# Patient Record
Sex: Male | Born: 1940
Health system: Southern US, Community
[De-identification: ages and names within clinical notes are randomized; demographics above are authoritative.]

## PROBLEM LIST (undated history)

## (undated) DIAGNOSIS — S12100A Unspecified displaced fracture of second cervical vertebra, initial encounter for closed fracture: Secondary | ICD-10-CM

## (undated) DIAGNOSIS — C4490 Unspecified malignant neoplasm of skin, unspecified: Secondary | ICD-10-CM

## (undated) DIAGNOSIS — I4891 Unspecified atrial fibrillation: Secondary | ICD-10-CM

## (undated) DIAGNOSIS — E785 Hyperlipidemia, unspecified: Secondary | ICD-10-CM

## (undated) DIAGNOSIS — I10 Essential (primary) hypertension: Secondary | ICD-10-CM

## (undated) DIAGNOSIS — M109 Gout, unspecified: Secondary | ICD-10-CM

## (undated) HISTORY — PX: CERVICAL DISC ARTHROPLASTY: SHX587

## (undated) HISTORY — PX: MOHS SURGERY: SHX181

## (undated) HISTORY — DX: Unspecified displaced fracture of second cervical vertebra, initial encounter for closed fracture: S12.100A

## (undated) HISTORY — PX: PROSTATE BIOPSY: SHX241

## (undated) HISTORY — DX: Gilbert syndrome: E80.4

## (undated) HISTORY — DX: Unspecified atrial fibrillation: I48.91

## (undated) HISTORY — PX: TONSILLECTOMY: SUR1361

## (undated) HISTORY — DX: Hyperlipidemia, unspecified: E78.5

---

## 1997-10-30 ENCOUNTER — Emergency Department (HOSPITAL_COMMUNITY): Admission: EM | Admit: 1997-10-30 | Discharge: 1997-10-30 | Payer: Self-pay | Admitting: Emergency Medicine

## 1998-08-09 ENCOUNTER — Encounter: Payer: Self-pay | Admitting: Internal Medicine

## 1998-08-09 ENCOUNTER — Ambulatory Visit (HOSPITAL_COMMUNITY): Admission: RE | Admit: 1998-08-09 | Discharge: 1998-08-09 | Payer: Self-pay | Admitting: Internal Medicine

## 2001-02-03 ENCOUNTER — Ambulatory Visit (HOSPITAL_COMMUNITY): Admission: RE | Admit: 2001-02-03 | Discharge: 2001-02-03 | Payer: Self-pay | Admitting: Internal Medicine

## 2002-01-04 ENCOUNTER — Ambulatory Visit (HOSPITAL_COMMUNITY): Admission: RE | Admit: 2002-01-04 | Discharge: 2002-01-04 | Payer: Self-pay | Admitting: Gastroenterology

## 2002-06-02 ENCOUNTER — Emergency Department (HOSPITAL_COMMUNITY): Admission: EM | Admit: 2002-06-02 | Discharge: 2002-06-02 | Payer: Self-pay | Admitting: Emergency Medicine

## 2002-06-02 ENCOUNTER — Encounter: Payer: Self-pay | Admitting: Emergency Medicine

## 2003-10-09 ENCOUNTER — Encounter: Admission: RE | Admit: 2003-10-09 | Discharge: 2003-10-09 | Payer: Self-pay | Admitting: Internal Medicine

## 2003-11-08 ENCOUNTER — Ambulatory Visit (HOSPITAL_COMMUNITY): Admission: RE | Admit: 2003-11-08 | Discharge: 2003-11-08 | Payer: Self-pay | Admitting: Neurosurgery

## 2004-02-13 ENCOUNTER — Inpatient Hospital Stay (HOSPITAL_COMMUNITY): Admission: RE | Admit: 2004-02-13 | Discharge: 2004-02-22 | Payer: Self-pay | Admitting: Neurosurgery

## 2004-02-21 IMAGING — CR DG CERVICAL SPINE 1V
1 series · 1 of 1 positions shown · non-contrast
Comparison: Cervical CT myelogram [DATE].

CLINICAL DATA: Cervical corpectomy with fusion from C3 through C6.

CERVICAL SPINE - ONE VIEW [DATE]:

[view not recorded]
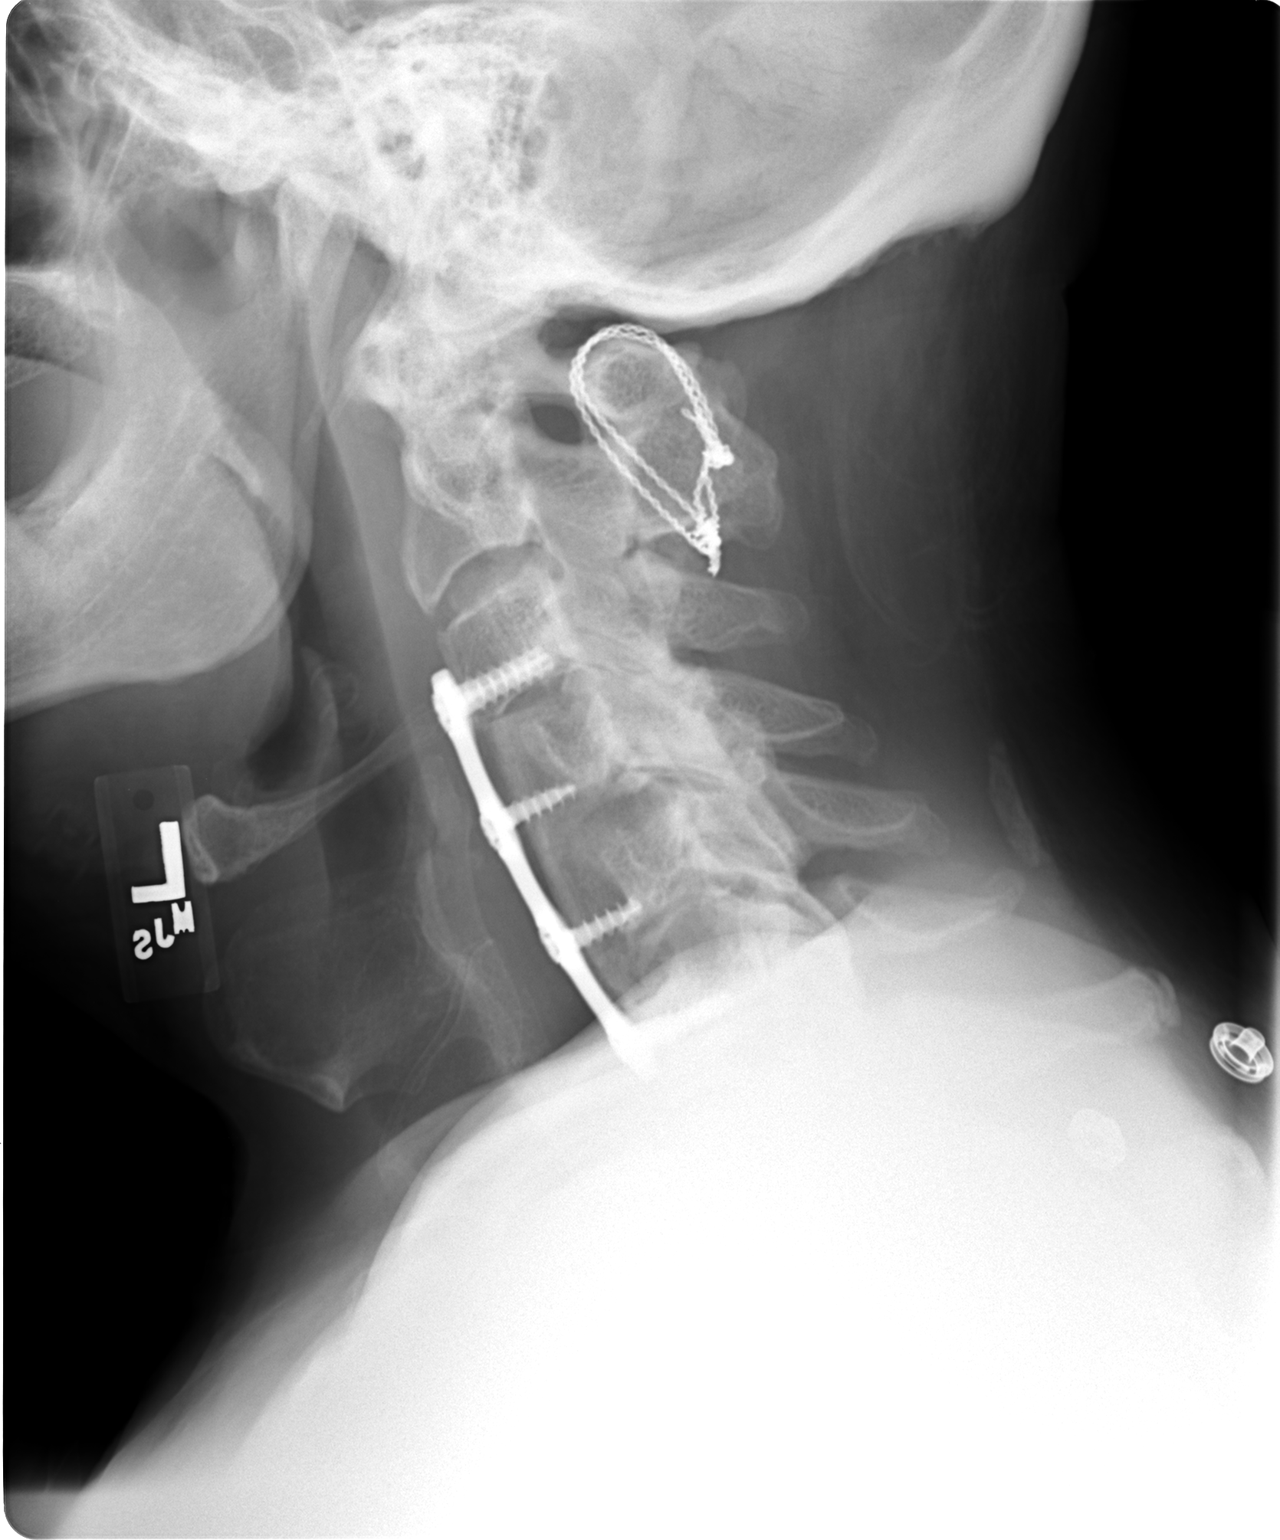

[1 of 1 positions shown; findings below may reference images not displayed]

FINDINGS: Lateral view of the cervical spine demonstrates cervical corpectomy with fusion from C3
through C6. There has also been posterior fusion at C1-C2 with the use of cerclage wires. There is
3-4 mm of spondylolisthesis of C4 relative to C5, though this was present on the preoperative CT
myelogram and is unchanged C7 was not visualized on this examination. Calcification or ossification
in the nuchal ligament is noted posterior to C5.

IMPRESSION

Status post corpectomy with fusion from C3 through C6. The slight spondylolisthesis of C4 relative
to C-5 is unchanged from the preoperative CT myelogram. No complicating features are noted. The
patient has also undergone C1-C2 posterior fusion with cerclage wires.

## 2004-03-13 ENCOUNTER — Encounter: Admission: RE | Admit: 2004-03-13 | Discharge: 2004-05-16 | Payer: Self-pay | Admitting: Neurosurgery

## 2004-07-28 ENCOUNTER — Observation Stay (HOSPITAL_COMMUNITY): Admission: EM | Admit: 2004-07-28 | Discharge: 2004-07-29 | Payer: Self-pay | Admitting: Emergency Medicine

## 2005-12-24 ENCOUNTER — Emergency Department (HOSPITAL_COMMUNITY): Admission: EM | Admit: 2005-12-24 | Discharge: 2005-12-24 | Payer: Self-pay | Admitting: Emergency Medicine

## 2011-11-22 ENCOUNTER — Emergency Department (HOSPITAL_BASED_OUTPATIENT_CLINIC_OR_DEPARTMENT_OTHER): Payer: Medicare Other

## 2011-11-22 ENCOUNTER — Emergency Department (HOSPITAL_BASED_OUTPATIENT_CLINIC_OR_DEPARTMENT_OTHER)
Admission: EM | Admit: 2011-11-22 | Discharge: 2011-11-22 | Disposition: A | Discharge status: Home / Self Care | Payer: Medicare Other | Attending: Emergency Medicine | Admitting: Emergency Medicine

## 2011-11-22 ENCOUNTER — Encounter (HOSPITAL_BASED_OUTPATIENT_CLINIC_OR_DEPARTMENT_OTHER): Payer: Self-pay | Admitting: *Deleted

## 2011-11-22 DIAGNOSIS — S6990XA Unspecified injury of unspecified wrist, hand and finger(s), initial encounter: Secondary | ICD-10-CM | POA: Insufficient documentation

## 2011-11-22 DIAGNOSIS — Y92838 Other recreation area as the place of occurrence of the external cause: Secondary | ICD-10-CM | POA: Insufficient documentation

## 2011-11-22 DIAGNOSIS — Z87891 Personal history of nicotine dependence: Secondary | ICD-10-CM | POA: Insufficient documentation

## 2011-11-22 DIAGNOSIS — M25529 Pain in unspecified elbow: Secondary | ICD-10-CM

## 2011-11-22 DIAGNOSIS — Y9239 Other specified sports and athletic area as the place of occurrence of the external cause: Secondary | ICD-10-CM | POA: Insufficient documentation

## 2011-11-22 DIAGNOSIS — Z85828 Personal history of other malignant neoplasm of skin: Secondary | ICD-10-CM | POA: Insufficient documentation

## 2011-11-22 DIAGNOSIS — S59909A Unspecified injury of unspecified elbow, initial encounter: Secondary | ICD-10-CM | POA: Insufficient documentation

## 2011-11-22 DIAGNOSIS — X503XXA Overexertion from repetitive movements, initial encounter: Secondary | ICD-10-CM

## 2011-11-22 DIAGNOSIS — M109 Gout, unspecified: Secondary | ICD-10-CM | POA: Insufficient documentation

## 2011-11-22 DIAGNOSIS — I10 Essential (primary) hypertension: Secondary | ICD-10-CM | POA: Insufficient documentation

## 2011-11-22 HISTORY — DX: Essential (primary) hypertension: I10

## 2011-11-22 HISTORY — DX: Gout, unspecified: M10.9

## 2011-11-22 HISTORY — DX: Unspecified malignant neoplasm of skin, unspecified: C44.90

## 2011-11-22 MED ORDER — PREDNISONE 20 MG PO TABS
40.0000 mg | ORAL_TABLET | Freq: Once | ORAL | Status: AC
  Administered 2011-11-22: 40 mg via ORAL
  Filled 2011-11-22: qty 2

## 2011-11-22 MED ORDER — OXYCODONE-ACETAMINOPHEN 5-325 MG PO TABS: 1.0000 | ORAL_TABLET | ORAL | Status: AC | PRN

## 2011-11-22 MED ORDER — PREDNISONE 20 MG PO TABS: 40.0000 mg | ORAL_TABLET | Freq: Every day | ORAL | Status: AC

## 2011-11-22 MED ORDER — OXYCODONE-ACETAMINOPHEN 5-325 MG PO TABS
2.0000 | ORAL_TABLET | Freq: Once | ORAL | Status: AC
  Administered 2011-11-22: 2 via ORAL
  Filled 2011-11-22: qty 2

## 2011-11-22 MED ORDER — IBUPROFEN 400 MG PO TABS
400.0000 mg | ORAL_TABLET | Freq: Once | ORAL | Status: DC
  Filled 2011-11-22: qty 1

## 2011-11-22 NOTE — ED Provider Notes (Signed)
History   This chart was scribed for Wayne Razor, MD by Wayne Howell. The patient was seen in room MH12/MH12. Patient's care was started at 1609.     CSN: [id number]  Arrival date & time 11/22/11  1609   First MD Initiated Contact with Patient 11/22/11 1614      Chief Complaint  Patient presents with  . Arm Injury    (Consider location/radiation/quality/duration/timing/severity/associated sxs/prior treatment) Patient is a 71 y.o. male presenting with arm injury. The history is provided by the patient. No language interpreter was used.  Arm Injury  The incident occurred more than 2 days ago. Incident location: The gym. The injury mechanism was a pulled limb. Context: Weight lifting. The wounds were self-inflicted. There is an injury to the right elbow. The pain is severe. It is unlikely that a foreign body is present. Associated symptoms include pain when bearing weight. Pertinent negatives include no chest pain, no fussiness, no numbness, no visual disturbance, no bladder incontinence, no headaches, no hearing loss, no cough and no difficulty breathing. There have been no prior injuries to these areas.    Wayne Howell is a 71 y.o. male who presents to the Emergency Department complaining of waxing and waning, gradually worsening, moderate to severe right elbow pain onset 3 days ago. Patient says that he went to the gym on Wednesday to work out his arms. He increased his lifting weight and his reps. Patient says that patient reached to hold open a door at lunch on Thursday and felt a 'twinge." Patient denies fevers, chills, or any other pain. Patient has been taking Tylenol and Colycrys (gout medicine). He says that Tylenol provides no relief, but the gout medication provided some moderate relief. Patient has also been applying warm compresses with some relief. Patient says that he has had tennis elbow years ago, but it was never this severe. Patient says that he has intermittent pain in  his right arm as a result of a nicked nerve during a surgery several years ago. Patient has a medical history of gout, skin cancer and HTN. Surgical history includes tonsillectomy, Mohs surgery and cervical disc arthroplasty. Patient is a former smoker. Patient is allergic to codeine.  Past Medical History  Diagnosis Date  . Gout   . Skin cancer   . Hypertension     Past Surgical History  Procedure Date  . Cervical disc arthroplasty   . Tonsillectomy   . Mohs surgery     No family history on file.  History  Substance Use Topics  . Smoking status: Former Games developer  . Smokeless tobacco: Never Used  . Alcohol Use: 1.5 oz/week    3 drink(s) per week      Review of Systems  Constitutional: Negative for fever and chills.  HENT: Negative for hearing loss.   Eyes: Negative for visual disturbance.  Respiratory: Negative for cough.   Cardiovascular: Negative for chest pain.  Genitourinary: Negative for bladder incontinence.  Musculoskeletal:       Right elbow pain  Neurological: Negative for numbness and headaches.  All other systems reviewed and are negative.    Allergies  Review of patient's allergies indicates not on file.  Home Medications  No current outpatient prescriptions on file.  BP 162/68  Pulse 64  Temp 97.9 F (36.6 C) (Oral)  Resp 20  Ht 5\' 10"  (1.778 m)  Wt 225 lb (102.059 kg)  BMI 32.28 kg/m2  SpO2 98%  Physical Exam  Nursing note and vitals reviewed.  Constitutional: He appears well-developed and well-nourished. No distress.  HENT:  Head: Normocephalic and atraumatic.  Eyes: Conjunctivae are normal. Right eye exhibits no discharge. Left eye exhibits no discharge.  Neck: Neck supple.  Cardiovascular: Normal rate, regular rhythm and normal heart sounds.  Exam reveals no gallop and no friction rub.   No murmur heard. Pulmonary/Chest: Effort normal and breath sounds normal. No respiratory distress.  Abdominal: Soft. He exhibits no distension. There  is no tenderness.  Musculoskeletal: He exhibits no edema and no tenderness.       Right elbow: He exhibits swelling.       Right elbow is swollen. No increased warmth or erythema. Pain throughout ROM. Neurovascularly intact distally.  Neurological: He is alert.  Skin: Skin is warm and dry.  Psychiatric: He has a normal mood and affect. His behavior is normal. Thought content normal.    ED Course  Procedures (including critical care time) DIAGNOSTIC STUDIES: Oxygen Saturation is 98% on room air, normal by my interpretation.    COORDINATION OF CARE: 4:40pm- Patient informed of current plan for treatment and evaluation and agrees with plan at this time.   Labs Reviewed - No data to display   Dg Elbow Complete Right  11/22/2011  *RADIOLOGY REPORT*  Clinical Data: Right elbow pain and swelling  RIGHT ELBOW - COMPLETE 3+ VIEW  Comparison: None.  Findings: Four views of the right elbow submitted.  No acute fracture or subluxation.  There is soft tissue swelling dorsal elbow region.  No posterior fat pad sign.  IMPRESSION: No acute fracture or subluxation.  Soft tissue swelling dorsally elbow region.  Original Report Authenticated By: Wayne Howell, M.D.     1. Overuse injury   2. Elbow pain       MDM  71 year male with right elbow pain. Suspect overuse injury. Consider Calc given his history but doubt. Doubt infectious. Plan symptomatic treatment. Return precautions discussed. Outpatient followup.      I personally preformed the services scribed in my presence. The recorded information has been reviewed and considered. Wayne Razor, MD.    Wayne Razor, MD 11/27/11 1248

## 2011-11-22 NOTE — ED Notes (Signed)
Pt reports he worked out [employer] on Wednesday- states he reached for something Thursday and has had right elbow pain since then

## 2013-05-19 ENCOUNTER — Other Ambulatory Visit: Payer: Self-pay | Admitting: Internal Medicine

## 2013-05-19 ENCOUNTER — Ambulatory Visit
Admission: RE | Admit: 2013-05-19 | Discharge: 2013-05-19 | Disposition: A | Discharge status: Home / Self Care | Payer: Medicare HMO | Source: Ambulatory Visit | Attending: Internal Medicine | Admitting: Internal Medicine

## 2013-05-19 DIAGNOSIS — M25559 Pain in unspecified hip: Secondary | ICD-10-CM

## 2013-12-20 ENCOUNTER — Ambulatory Visit
Admission: RE | Admit: 2013-12-20 | Discharge: 2013-12-20 | Disposition: A | Discharge status: Home / Self Care | Payer: Commercial Managed Care - HMO | Source: Ambulatory Visit | Attending: Internal Medicine | Admitting: Internal Medicine

## 2013-12-20 ENCOUNTER — Other Ambulatory Visit: Payer: Self-pay | Admitting: Internal Medicine

## 2013-12-20 DIAGNOSIS — M25512 Pain in left shoulder: Secondary | ICD-10-CM

## 2014-04-13 ENCOUNTER — Telehealth: Payer: Self-pay | Admitting: Cardiology

## 2014-04-13 NOTE — Telephone Encounter (Signed)
Received medical records on Wayne Howell from [facility] for upcoming appointment to see Dr. Percival Spanish on 04/16/2014, records given to [facility].  cbr

## 2014-04-16 ENCOUNTER — Ambulatory Visit (INDEPENDENT_AMBULATORY_CARE_PROVIDER_SITE_OTHER): Payer: Commercial Managed Care - HMO | Admitting: Cardiology

## 2014-04-16 ENCOUNTER — Encounter: Payer: Self-pay | Admitting: Cardiology

## 2014-04-16 VITALS — BP 140/80 | HR 76 | Ht 70.0 in | Wt 223.9 lb

## 2014-04-16 DIAGNOSIS — I481 Persistent atrial fibrillation: Secondary | ICD-10-CM

## 2014-04-16 DIAGNOSIS — I4819 Other persistent atrial fibrillation: Secondary | ICD-10-CM

## 2014-04-16 DIAGNOSIS — I4891 Unspecified atrial fibrillation: Secondary | ICD-10-CM | POA: Insufficient documentation

## 2014-04-16 NOTE — Patient Instructions (Addendum)
We are going to order a 24 hr holter monitor  We are going to order an Echo for to get done  We are going to schedule a cardioversion for you to have done in one month

## 2014-04-16 NOTE — Progress Notes (Signed)
HPI The patient presents for evaluation of new atrial fibrillation. He was found to have this recently to have atrial fibrillation. He noticed last week that he was a little bit nauseated. He couldn't breathe properly though he wasn't short of breath. He has not been having any chest pressure, neck or arm discomfort. He has not been having any palpitations, presyncope or syncope. His heart rate has been slightly elevated.  He is somewhat limited by back pain. He was working out with a Clinical research associate and he injured himself somewhat. However, with his activities he denies any cardiovascular symptoms.  The patient denies any new symptoms such as chest discomfort, neck or arm discomfort. There has been no new shortness of breath, PND or orthopnea. There have been no reported palpitations, presyncope or syncope.  Allergies  Allergen Reactions  . Codeine Nausea And Vomiting    Current Outpatient Prescriptions  Medication Sig Dispense Refill  . CRESTOR 5 MG tablet Take 5 mg by mouth daily.     Marland Kitchen lisinopril (PRINIVIL,ZESTRIL) 20 MG tablet Take 20 mg by mouth daily.    . rivaroxaban (XARELTO) 20 MG TABS tablet Take 20 mg by mouth daily.    . rosuvastatin (CRESTOR) 10 MG tablet Take 10 mg by mouth daily.    . tamsulosin (FLOMAX) 0.4 MG CAPS capsule      No current facility-administered medications for this visit.    Past Medical History  Diagnosis Date  . Gout   . Skin cancer   . Hypertension   . Gilbert syndrome   . Atrial fibrillation   . Hyperlipidemia   . Gilbert's disease   . C2 cervical fracture     Past Surgical History  Procedure Laterality Date  . Cervical disc arthroplasty    . Tonsillectomy    . Mohs surgery      Family History  Problem Relation Age of Onset  . Cancer Mother     breast  . Heart attack Father   . Heart disease Father     History   Social History  . Marital Status: Married    Spouse Name: N/A    Number of Children: N/A  . Years of Education: N/A    Occupational History  . Not on file.   Social History Main Topics  . Smoking status: Former Research scientist (life sciences)  . Smokeless tobacco: Never Used  . Alcohol Use: 1.5 oz/week    3 drink(s) per week  . Drug Use: No  . Sexual Activity: Not on file   Other Topics Concern  . Not on file   Social History Narrative    ROS:  Limited by lower back.  Otherwise as stated in the HPI and negative for all other systems.    PHYSICAL EXAM BP 140/80 mmHg  Pulse 76  Ht 5\' 10"  (1.778 m)  Wt 223 lb 14.4 oz (101.56 kg)  BMI 32.13 kg/m2  GENERAL:  Well appearing HEENT:  Pupils equal round and reactive, fundi not visualized, oral mucosa unremarkable NECK:  No jugular venous distention, waveform within normal limits, carotid upstroke brisk and symmetric, no bruits, no thyromegaly LYMPHATICS:  No cervical, inguinal adenopathy LUNGS:  Clear to auscultation bilaterally BACK:  No CVA tenderness CHEST:  Unremarkable HEART:  PMI not displaced or sustained,S1 and S2 within normal limits, no S3, no clicks, no rubs, no murmurs, irregular ABD:  Flat, positive bowel sounds normal in frequency in pitch, no bruits, no rebound, no guarding, no midline pulsatile mass, no hepatomegaly, no splenomegaly  EXT:  2 plus pulses throughout, no edema, no cyanosis no clubbing SKIN:  No rashes no nodules NEURO:  Cranial nerves II through XII grossly intact, motor grossly intact throughout PSYCH:  Cognitively intact, oriented to person place and time   EKG:  Atrial fibrillation, rate 76, axis WNL, no acute ST T wave changes.  04/16/2014  ASSESSMENT AND PLAN  ATRIAL FIB:  Wayne Howell has a CHA2DS2 - VASc score of 3 with a risk of stroke of 3.2%  and a HAS - BLED score of 1 with a moderate risk of bleeding.  We had a long discussion about the risk benefits of anticoagulation. He had a meeting with our pharmacist. All questions were answered. He will be on Xarelto.  I will get an echocardiogram. A 24-hour Holter to make sure he  is in permanent fibrillation. I will bring him back for cardioversion I have discussed the risk benefits of this as well. I will look for labs drawn recently which hopefully included TSH. His creatinine was otherwise normal.  HTN:  The blood pressure is at target. No change in medications is indicated. We will continue with therapeutic lifestyle changes (TLC).

## 2014-04-18 ENCOUNTER — Ambulatory Visit (HOSPITAL_COMMUNITY)
Admission: RE | Admit: 2014-04-18 | Discharge: 2014-04-18 | Disposition: A | Discharge status: Home / Self Care | Payer: Commercial Managed Care - HMO | Source: Ambulatory Visit | Attending: Cardiology | Admitting: Cardiology

## 2014-04-18 DIAGNOSIS — I4819 Other persistent atrial fibrillation: Secondary | ICD-10-CM

## 2014-04-18 DIAGNOSIS — I379 Nonrheumatic pulmonary valve disorder, unspecified: Secondary | ICD-10-CM

## 2014-04-18 DIAGNOSIS — I10 Essential (primary) hypertension: Secondary | ICD-10-CM | POA: Insufficient documentation

## 2014-04-18 DIAGNOSIS — I4891 Unspecified atrial fibrillation: Secondary | ICD-10-CM | POA: Insufficient documentation

## 2014-04-18 DIAGNOSIS — I481 Persistent atrial fibrillation: Secondary | ICD-10-CM

## 2014-04-18 NOTE — Progress Notes (Signed)
2D Echo Performed 04/18/2014    Marygrace Drought, RCS

## 2014-04-23 ENCOUNTER — Encounter: Payer: Self-pay | Admitting: Cardiology

## 2014-04-25 ENCOUNTER — Other Ambulatory Visit: Payer: Self-pay | Admitting: *Deleted

## 2014-04-25 ENCOUNTER — Telehealth: Payer: Self-pay | Admitting: Cardiology

## 2014-04-25 MED ORDER — RIVAROXABAN 20 MG PO TABS: 20.0000 mg | ORAL_TABLET | Freq: Every day | ORAL | Status: DC

## 2014-04-25 NOTE — Telephone Encounter (Signed)
Pt need a new prescription for Xarelto. Please call it in today to [facility].

## 2014-04-30 ENCOUNTER — Other Ambulatory Visit: Payer: Self-pay | Admitting: *Deleted

## 2014-04-30 ENCOUNTER — Encounter: Payer: Self-pay | Admitting: Cardiovascular Disease

## 2014-04-30 ENCOUNTER — Telehealth: Payer: Self-pay | Admitting: Cardiology

## 2014-04-30 DIAGNOSIS — Z01812 Encounter for preprocedural laboratory examination: Secondary | ICD-10-CM

## 2014-04-30 DIAGNOSIS — I4819 Other persistent atrial fibrillation: Secondary | ICD-10-CM

## 2014-04-30 NOTE — Telephone Encounter (Signed)
Spoke w/ pt about echo results.  Per last OV, pt should be scheduled for f/u cardioversion. Will route to scheduling.

## 2014-04-30 NOTE — Telephone Encounter (Signed)
Pt says he can not read echo results on my-chart.

## 2014-05-14 ENCOUNTER — Ambulatory Visit
Admission: RE | Admit: 2014-05-14 | Discharge: 2014-05-14 | Disposition: A | Discharge status: Home / Self Care | Payer: Commercial Managed Care - HMO | Source: Ambulatory Visit | Attending: Cardiology | Admitting: Cardiology

## 2014-05-14 ENCOUNTER — Encounter (HOSPITAL_COMMUNITY): Payer: Self-pay | Admitting: Pharmacy Technician

## 2014-05-14 ENCOUNTER — Telehealth: Payer: Self-pay | Admitting: Cardiology

## 2014-05-14 NOTE — Telephone Encounter (Signed)
Returning a call from last Wednesday,he thinks it is about his Echo results.

## 2014-05-14 NOTE — Telephone Encounter (Signed)
Pt. Informed about his echo results

## 2014-05-15 LAB — CBC
HCT: 43.8 % (ref 39.0–52.0)
HEMOGLOBIN: 15.1 g/dL (ref 13.0–17.0)
MCH: 31.8 pg (ref 26.0–34.0)
MCHC: 34.5 g/dL (ref 30.0–36.0)
MCV: 92.2 fL (ref 78.0–100.0)
MPV: 10.4 fL (ref 8.6–12.4)
Platelets: 127 10*3/uL — ABNORMAL LOW (ref 150–400)
RBC: 4.75 MIL/uL (ref 4.22–5.81)
RDW: 13.3 % (ref 11.5–15.5)
WBC: 4.4 10*3/uL (ref 4.0–10.5)

## 2014-05-15 LAB — BASIC METABOLIC PANEL
BUN: 10 mg/dL (ref 6–23)
CO2: 29 mEq/L (ref 19–32)
Calcium: 8.7 mg/dL (ref 8.4–10.5)
Chloride: 101 mEq/L (ref 96–112)
Creat: 0.91 mg/dL (ref 0.50–1.35)
GLUCOSE: 91 mg/dL (ref 70–99)
Potassium: 4.9 mEq/L (ref 3.5–5.3)
SODIUM: 137 meq/L (ref 135–145)

## 2014-05-15 LAB — TSH: TSH: 0.29 u[IU]/mL — ABNORMAL LOW (ref 0.350–4.500)

## 2014-05-16 ENCOUNTER — Ambulatory Visit (HOSPITAL_COMMUNITY): Payer: Commercial Managed Care - HMO | Admitting: Certified Registered Nurse Anesthetist

## 2014-05-16 ENCOUNTER — Encounter (HOSPITAL_COMMUNITY)
Admission: RE | Disposition: A | Discharge status: Home / Self Care | Payer: Self-pay | Source: Ambulatory Visit | Attending: Cardiovascular Disease

## 2014-05-16 ENCOUNTER — Ambulatory Visit (HOSPITAL_COMMUNITY)
Admission: RE | Admit: 2014-05-16 | Discharge: 2014-05-16 | Disposition: A | Discharge status: Home / Self Care | Payer: Commercial Managed Care - HMO | Source: Ambulatory Visit | Attending: Cardiovascular Disease | Admitting: Cardiovascular Disease

## 2014-05-16 ENCOUNTER — Encounter (HOSPITAL_COMMUNITY): Payer: Self-pay | Admitting: *Deleted

## 2014-05-16 DIAGNOSIS — I4891 Unspecified atrial fibrillation: Secondary | ICD-10-CM | POA: Diagnosis not present

## 2014-05-16 DIAGNOSIS — M109 Gout, unspecified: Secondary | ICD-10-CM | POA: Insufficient documentation

## 2014-05-16 DIAGNOSIS — E785 Hyperlipidemia, unspecified: Secondary | ICD-10-CM | POA: Insufficient documentation

## 2014-05-16 DIAGNOSIS — I10 Essential (primary) hypertension: Secondary | ICD-10-CM | POA: Insufficient documentation

## 2014-05-16 DIAGNOSIS — Z87891 Personal history of nicotine dependence: Secondary | ICD-10-CM | POA: Diagnosis not present

## 2014-05-16 DIAGNOSIS — Z885 Allergy status to narcotic agent status: Secondary | ICD-10-CM | POA: Insufficient documentation

## 2014-05-16 DIAGNOSIS — Z85828 Personal history of other malignant neoplasm of skin: Secondary | ICD-10-CM | POA: Diagnosis not present

## 2014-05-16 HISTORY — PX: CARDIOVERSION: SHX1299

## 2014-05-16 SURGERY — CARDIOVERSION
Anesthesia: Monitor Anesthesia Care

## 2014-05-16 MED ORDER — SODIUM CHLORIDE 0.9 % IV SOLN
INTRAVENOUS | Status: DC | PRN
  Administered 2014-05-16: 09:00:00 via INTRAVENOUS

## 2014-05-16 MED ORDER — PROPOFOL 10 MG/ML IV BOLUS
INTRAVENOUS | Status: DC | PRN
  Administered 2014-05-16: 80 mg via INTRAVENOUS

## 2014-05-16 MED ORDER — LIDOCAINE HCL (CARDIAC) 20 MG/ML IV SOLN
INTRAVENOUS | Status: DC | PRN
  Administered 2014-05-16: 60 mg via INTRAVENOUS

## 2014-05-16 MED ORDER — SODIUM CHLORIDE 0.9 % IV SOLN
INTRAVENOUS | Status: DC
  Administered 2014-05-16: 09:00:00 via INTRAVENOUS

## 2014-05-16 NOTE — Anesthesia Preprocedure Evaluation (Addendum)
Anesthesia Evaluation  Patient identified by MRN, date of birth, ID band Patient awake    Reviewed: Allergy & Precautions, NPO status , Patient's Chart, lab work & pertinent test results  History of Anesthesia Complications Negative for: history of anesthetic complications  Airway Mallampati: II  TM Distance: >3 FB Neck ROM: Limited    Dental  (+) Teeth Intact, Dental Advisory Given   Pulmonary former smoker,          Cardiovascular hypertension, + dysrhythmias Atrial Fibrillation  03/2014 EF 55-60%   Neuro/Psych Hx C2 fracture, cervical disc arthroplasty    GI/Hepatic Gilbert's disease   Endo/Other    Renal/GU      Musculoskeletal   Abdominal   Peds  Hematology   Anesthesia Other Findings   Reproductive/Obstetrics                          Anesthesia Physical Anesthesia Plan  ASA: II  Anesthesia Plan: MAC   Post-op Pain Management:    Induction: Intravenous  Airway Management Planned: Mask  Additional Equipment:   Intra-op Plan:   Post-operative Plan:   Informed Consent: I have reviewed the patients History and Physical, chart, labs and discussed the procedure including the risks, benefits and alternatives for the proposed anesthesia with the patient or authorized representative who has indicated his/her understanding and acceptance.   Dental advisory given  Plan Discussed with: CRNA, Anesthesiologist and Surgeon  Anesthesia Plan Comments:        Anesthesia Quick Evaluation

## 2014-05-16 NOTE — H&P (View-Only) (Signed)
HPI The patient presents for evaluation of new atrial fibrillation. He was found to have this recently to have atrial fibrillation. He noticed last week that he was a little bit nauseated. He couldn't breathe properly though he wasn't short of breath. He has not been having any chest pressure, neck or arm discomfort. He has not been having any palpitations, presyncope or syncope. His heart rate has been slightly elevated.  He is somewhat limited by back pain. He was working out with a Clinical research associate and he injured himself somewhat. However, with his activities he denies any cardiovascular symptoms.  The patient denies any new symptoms such as chest discomfort, neck or arm discomfort. There has been no new shortness of breath, PND or orthopnea. There have been no reported palpitations, presyncope or syncope.  Allergies  Allergen Reactions  . Codeine Nausea And Vomiting    Current Outpatient Prescriptions  Medication Sig Dispense Refill  . CRESTOR 5 MG tablet Take 5 mg by mouth daily.     Marland Kitchen lisinopril (PRINIVIL,ZESTRIL) 20 MG tablet Take 20 mg by mouth daily.    . rivaroxaban (XARELTO) 20 MG TABS tablet Take 20 mg by mouth daily.    . rosuvastatin (CRESTOR) 10 MG tablet Take 10 mg by mouth daily.    . tamsulosin (FLOMAX) 0.4 MG CAPS capsule      No current facility-administered medications for this visit.    Past Medical History  Diagnosis Date  . Gout   . Skin cancer   . Hypertension   . Gilbert syndrome   . Atrial fibrillation   . Hyperlipidemia   . Gilbert's disease   . C2 cervical fracture     Past Surgical History  Procedure Laterality Date  . Cervical disc arthroplasty    . Tonsillectomy    . Mohs surgery      Family History  Problem Relation Age of Onset  . Cancer Mother     breast  . Heart attack Father   . Heart disease Father     History   Social History  . Marital Status: Married    Spouse Name: N/A    Number of Children: N/A  . Years of Education: N/A    Occupational History  . Not on file.   Social History Main Topics  . Smoking status: Former Research scientist (life sciences)  . Smokeless tobacco: Never Used  . Alcohol Use: 1.5 oz/week    3 drink(s) per week  . Drug Use: No  . Sexual Activity: Not on file   Other Topics Concern  . Not on file   Social History Narrative    ROS:  Limited by lower back.  Otherwise as stated in the HPI and negative for all other systems.    PHYSICAL EXAM BP 140/80 mmHg  Pulse 76  Ht 5\' 10"  (1.778 m)  Wt 223 lb 14.4 oz (101.56 kg)  BMI 32.13 kg/m2  GENERAL:  Well appearing HEENT:  Pupils equal round and reactive, fundi not visualized, oral mucosa unremarkable NECK:  No jugular venous distention, waveform within normal limits, carotid upstroke brisk and symmetric, no bruits, no thyromegaly LYMPHATICS:  No cervical, inguinal adenopathy LUNGS:  Clear to auscultation bilaterally BACK:  No CVA tenderness CHEST:  Unremarkable HEART:  PMI not displaced or sustained,S1 and S2 within normal limits, no S3, no clicks, no rubs, no murmurs, irregular ABD:  Flat, positive bowel sounds normal in frequency in pitch, no bruits, no rebound, no guarding, no midline pulsatile mass, no hepatomegaly, no splenomegaly  EXT:  2 plus pulses throughout, no edema, no cyanosis no clubbing SKIN:  No rashes no nodules NEURO:  Cranial nerves II through XII grossly intact, motor grossly intact throughout PSYCH:  Cognitively intact, oriented to person place and time   EKG:  Atrial fibrillation, rate 76, axis WNL, no acute ST T wave changes.  04/16/2014  ASSESSMENT AND PLAN  ATRIAL FIB:  [name] has a CHA2DS2 - VASc score of 3 with a risk of stroke of 3.2%  and a HAS - BLED score of 1 with a moderate risk of bleeding.  We had a long discussion about the risk benefits of anticoagulation. He had a meeting with our pharmacist. All questions were answered. He will be on Xarelto.  I will get an echocardiogram. A 24-hour Holter to make sure he  is in permanent fibrillation. I will bring him back for cardioversion I have discussed the risk benefits of this as well. I will look for labs drawn recently which hopefully included TSH. His creatinine was otherwise normal.  HTN:  The blood pressure is at target. No change in medications is indicated. We will continue with therapeutic lifestyle changes (TLC).

## 2014-05-16 NOTE — Interval H&P Note (Signed)
History and Physical Interval Note:  05/16/2014 8:29 AM  Wayne Howell  has presented today for surgery, with the diagnosis of afib  The various methods of treatment have been discussed with the patient and family. After consideration of risks, benefits and other options for treatment, the patient has consented to  Procedure(s): CARDIOVERSION (N/A) as a surgical intervention .  The patient's history has been reviewed, patient examined, no change in status, stable for surgery.  I have reviewed the patient's chart and labs.  Questions were answered to the patient's satisfaction.     [name]

## 2014-05-16 NOTE — Anesthesia Postprocedure Evaluation (Signed)
Anesthesia Post Note  Patient: Wayne Howell  Procedure(s) Performed: Procedure(s) (LRB): CARDIOVERSION (N/A)  Anesthesia type: MAC  Patient location: PACU  Post pain: Pain level controlled and Adequate analgesia  Post assessment: Post-op Vital signs reviewed, Patient's Cardiovascular Status Stable and Respiratory Function Stable  Last Vitals:  Filed Vitals:   05/16/14 0938  BP: 108/58  Pulse: 55  Temp:   Resp: 16    Post vital signs: Reviewed and stable  Level of consciousness: awake, alert  and oriented  Complications: No apparent anesthesia complications

## 2014-05-16 NOTE — Discharge Instructions (Signed)

## 2014-05-16 NOTE — CV Procedure (Signed)
DCC:  Propofol and Lidocaine with Anesthesia On Rx Xarelto with no missed doses  DCC x 2  120 then 200J  Converted from Afib rate 110 to NSR rate 52   No immediate neurologic sequelae  Outpatient f/u Dr Shelda Altes

## 2014-05-16 NOTE — Transfer of Care (Signed)
Immediate Anesthesia Transfer of Care Note  Patient: Wayne Howell  Procedure(s) Performed: Procedure(s): CARDIOVERSION (N/A)  Patient Location: Endoscopy Unit  Anesthesia Type:MAC  Level of Consciousness: awake, alert  and oriented  Airway & Oxygen Therapy: Patient Spontanous Breathing and Patient connected to nasal cannula oxygen  Post-op Assessment: Report given to PACU RN, Post -op Vital signs reviewed and stable and Patient moving all extremities X 4  Post vital signs: Reviewed and stable  Complications: No apparent anesthesia complications

## 2014-05-17 ENCOUNTER — Encounter (HOSPITAL_COMMUNITY): Payer: Self-pay | Admitting: Cardiovascular Disease

## 2014-05-21 ENCOUNTER — Other Ambulatory Visit: Payer: Self-pay | Admitting: *Deleted

## 2014-05-21 DIAGNOSIS — R899 Unspecified abnormal finding in specimens from other organs, systems and tissues: Secondary | ICD-10-CM

## 2014-05-29 LAB — T3: T3, Total: 80.6 ng/dL (ref 80.0–204.0)

## 2014-05-29 LAB — T4: T4, Total: 6.7 ug/dL (ref 4.5–12.0)

## 2014-09-17 ENCOUNTER — Telehealth: Payer: Self-pay | Admitting: Cardiology

## 2014-09-17 NOTE — Telephone Encounter (Signed)
Have him come back for an EKG.

## 2014-09-17 NOTE — Telephone Encounter (Signed)
Patient call patched through from [facility] office.  Patient called about dyspnea, notes ongoing for ~2 weeks.  He notes this is mostly at night, accompanied w/ a feeling of "discomfort". He states this is alleviated by sitting up. He denies lower extremity swelling.  Notes that he went to gym yesterday, didn't notice any dyspnea w/ activity.   Concerned d/t having similar symptoms accompanying his A fib. He was cardioverted back in January and reports that since then, no problems. He does note radial pulse seems to be regular, w/ rate of about 60 usually when checked.  Advised patient I would defer to Dr. Percival Spanish for best recommendation.

## 2014-09-17 NOTE — Telephone Encounter (Signed)
Pt c/o Shortness Of Breath: STAT if SOB developed within the last 24 hours or pt is noticeably SOB on the phone  1. Are you currently SOB (can you hear that pt is SOB on the phone)? Yes  2. How long have you been experiencing SOB? 2 weeks  3. Are you SOB when sitting or when up moving around? Mostly when he is in bed at night   4. Are you currently experiencing any other symptoms? "Uncomfortable feeling"

## 2014-09-18 ENCOUNTER — Ambulatory Visit (INDEPENDENT_AMBULATORY_CARE_PROVIDER_SITE_OTHER): Payer: Commercial Managed Care - HMO

## 2014-09-18 VITALS — BP 124/70 | HR 45 | Ht 70.0 in | Wt 228.0 lb

## 2014-09-18 DIAGNOSIS — I4819 Other persistent atrial fibrillation: Secondary | ICD-10-CM

## 2014-09-18 DIAGNOSIS — I481 Persistent atrial fibrillation: Secondary | ICD-10-CM | POA: Diagnosis not present

## 2014-09-18 NOTE — Telephone Encounter (Signed)
Pt. Called and informed of Dr. Rosezella Florida instructions, pt. Stated he would be in today around 1:30 today

## 2014-09-18 NOTE — Patient Instructions (Addendum)
Continue same medications   Appointment scheduled with Dr.Hochrein 11/01/14 at 10:30 am

## 2014-09-18 NOTE — Progress Notes (Signed)
1.) Reason for visit: EKG  2.) Name of MD requesting visit: Dr.Hochrein  3.) H&P:AFib  4.) ROS related to problem: Patient feeling better today.Stated has been having a uncomfortable feeling in chest,no chest pain,sob.EKG reviewed by Dr.Hochrein revealed NSR rate 45.  5.) Assessment and plan per MD: Continue same medications.Schedule follow up appointment.

## 2014-09-19 NOTE — Telephone Encounter (Signed)
Pt presented for EKG visit, printout read and signed by Dr. Percival Spanish.

## 2014-10-11 ENCOUNTER — Telehealth: Payer: Self-pay | Admitting: Cardiology

## 2014-10-11 ENCOUNTER — Encounter: Payer: Self-pay | Admitting: Cardiology

## 2014-10-11 NOTE — Telephone Encounter (Signed)
Closed encounter °

## 2014-10-20 ENCOUNTER — Other Ambulatory Visit: Payer: Self-pay | Admitting: Cardiology

## 2014-11-01 ENCOUNTER — Ambulatory Visit: Payer: Commercial Managed Care - HMO | Admitting: Cardiology

## 2014-11-16 ENCOUNTER — Ambulatory Visit (INDEPENDENT_AMBULATORY_CARE_PROVIDER_SITE_OTHER): Payer: Commercial Managed Care - HMO | Admitting: Cardiology

## 2014-11-16 ENCOUNTER — Encounter: Payer: Self-pay | Admitting: Cardiology

## 2014-11-16 VITALS — BP 136/78 | HR 50 | Ht 70.0 in | Wt 223.0 lb

## 2014-11-16 DIAGNOSIS — I481 Persistent atrial fibrillation: Secondary | ICD-10-CM

## 2014-11-16 DIAGNOSIS — I4819 Other persistent atrial fibrillation: Secondary | ICD-10-CM

## 2014-11-16 LAB — CBC
HEMATOCRIT: 44.7 % (ref 39.0–52.0)
Hemoglobin: 15.5 g/dL (ref 13.0–17.0)
MCH: 31.7 pg (ref 26.0–34.0)
MCHC: 34.7 g/dL (ref 30.0–36.0)
MCV: 91.4 fL (ref 78.0–100.0)
MPV: 10 fL (ref 8.6–12.4)
Platelets: 125 10*3/uL — ABNORMAL LOW (ref 150–400)
RBC: 4.89 MIL/uL (ref 4.22–5.81)
RDW: 13.4 % (ref 11.5–15.5)
WBC: 3.9 10*3/uL — ABNORMAL LOW (ref 4.0–10.5)

## 2014-11-16 NOTE — Patient Instructions (Signed)
Your physician wants you to follow-up in: [place] will receive a reminder letter in the mail two months in advance. If you don't receive a letter, please call our office to schedule the follow-up appointment.  Your physician recommends that you return for lab work CBC

## 2014-11-16 NOTE — Progress Notes (Signed)
   HPI The patient presents for evaluation of  atrial fibrillation. He is now status post cardioversion. He did have some palpitations after this when he came in to get an EKG was actually normal. He has otherwise felt well. He's otherwise not having any limitations, presyncope or syncope. He denies any chest pressure, neck or arm discomfort. He did hurt his shoulder recently doing some exercises.  Allergies  Allergen Reactions  . Codeine Nausea And Vomiting    Current Outpatient Prescriptions  Medication Sig Dispense Refill  . acetaminophen (TYLENOL) 500 MG tablet Take 500-1,000 mg by mouth every 6 (six) hours as needed for moderate pain.    Marland Kitchen CRESTOR 5 MG tablet Take 5 mg by mouth daily.     Marland Kitchen lisinopril (PRINIVIL,ZESTRIL) 20 MG tablet Take 20 mg by mouth daily.    . tamsulosin (FLOMAX) 0.4 MG CAPS capsule Take 0.4 mg by mouth daily.     Alveda Reasons 20 MG TABS tablet TAKE 1 TABLET BY MOUTH EVERY DAY 30 tablet 0   No current facility-administered medications for this visit.    Past Medical History  Diagnosis Date  . Gout   . Skin cancer   . Hypertension   . Atrial fibrillation   . Hyperlipidemia   . Gilbert's disease   . C2 cervical fracture     Past Surgical History  Procedure Laterality Date  . Cervical disc arthroplasty      x 2  . Tonsillectomy    . Mohs surgery    . Cardioversion N/A 05/16/2014    Procedure: CARDIOVERSION;  Surgeon: [name], MD;  Location: [facility] ENDOSCOPY;  Service: Cardiovascular;  Laterality: N/A;    ROS:  Limited by lower back.  Otherwise as stated in the HPI and negative for all other systems.    PHYSICAL EXAM BP 136/78 mmHg  Pulse 50  Ht 5\' 10"  (1.778 m)  Wt 223 lb (101.152 kg)  BMI 32.00 kg/m2  GENERAL:  Well appearing NECK:  No jugular venous distention, waveform within normal limits, carotid upstroke brisk and symmetric, no bruits, no thyromegaly LUNGS:  Clear to auscultation bilaterally BACK:  No CVA tenderness CHEST:   Unremarkable HEART:  PMI not displaced or sustained,S1 and S2 within normal limits, no S3, no clicks, no rubs, no murmurs, irregular ABD:  Flat, positive bowel sounds normal in frequency in pitch, no bruits, no rebound, no guarding, no midline pulsatile mass, no hepatomegaly, no splenomegaly EXT:  2 plus pulses throughout, no edema, no cyanosis no clubbing   EKG:  NSR, rate 76, axis WNL, no acute ST T wave changes.  11/16/2014  ASSESSMENT AND PLAN  ATRIAL FIB:  Mr. SHARON STAPEL has a CHA2DS2 - VASc score of 3 with a risk of stroke of 3.2%. He tolerates anticoagulation. No change in therapy is indicated.   HTN:  The blood pressure is at target. No change in medications is indicated. We will continue with therapeutic lifestyle changes (TLC).

## 2014-11-27 ENCOUNTER — Other Ambulatory Visit: Payer: Self-pay | Admitting: *Deleted

## 2014-11-27 MED ORDER — RIVAROXABAN 20 MG PO TABS: 20.0000 mg | ORAL_TABLET | Freq: Every day | ORAL | Status: DC

## 2015-01-14 ENCOUNTER — Telehealth: Payer: Self-pay | Admitting: Cardiology

## 2015-01-14 NOTE — Telephone Encounter (Signed)
New message      Pt want to talk to the nurse about maybe having an ablation.

## 2015-01-14 NOTE — Telephone Encounter (Signed)
He would not qualify for atrial fib ablation at this point.  We could consider this in the future if he fails medical therapy.

## 2015-01-14 NOTE — Telephone Encounter (Signed)
Patient wanted to know if our clinic doctors offer the procedure for Ablation  Told patient we do offer this procedure.  It is up to the physician if the person is a candidate and would benefit from the procedure  I told him that I would send his message to Dr. Warren Lacy that you would like to know if this would be of help to him

## 2015-01-15 NOTE — Telephone Encounter (Signed)
Called patient and discussed Dr. Cherlyn Cushing message.  Patient thankful and understands

## 2015-06-04 DIAGNOSIS — M109 Gout, unspecified: Secondary | ICD-10-CM | POA: Diagnosis not present

## 2015-07-10 DIAGNOSIS — M109 Gout, unspecified: Secondary | ICD-10-CM | POA: Diagnosis not present

## 2015-07-10 DIAGNOSIS — E7800 Pure hypercholesterolemia, unspecified: Secondary | ICD-10-CM | POA: Diagnosis not present

## 2015-07-10 DIAGNOSIS — I10 Essential (primary) hypertension: Secondary | ICD-10-CM | POA: Diagnosis not present

## 2015-07-10 DIAGNOSIS — Z0000 Encounter for general adult medical examination without abnormal findings: Secondary | ICD-10-CM | POA: Diagnosis not present

## 2015-07-10 DIAGNOSIS — Z139 Encounter for screening, unspecified: Secondary | ICD-10-CM | POA: Diagnosis not present

## 2015-07-10 DIAGNOSIS — R269 Unspecified abnormalities of gait and mobility: Secondary | ICD-10-CM | POA: Diagnosis not present

## 2015-07-10 DIAGNOSIS — I779 Disorder of arteries and arterioles, unspecified: Secondary | ICD-10-CM | POA: Diagnosis not present

## 2015-07-11 ENCOUNTER — Other Ambulatory Visit: Payer: Self-pay | Admitting: Family Medicine

## 2015-07-11 DIAGNOSIS — I779 Disorder of arteries and arterioles, unspecified: Secondary | ICD-10-CM

## 2015-07-11 DIAGNOSIS — I739 Peripheral vascular disease, unspecified: Principal | ICD-10-CM

## 2015-07-23 ENCOUNTER — Encounter: Payer: Self-pay | Admitting: Neurology

## 2015-07-23 ENCOUNTER — Ambulatory Visit (INDEPENDENT_AMBULATORY_CARE_PROVIDER_SITE_OTHER): Payer: Medicare Other | Admitting: Neurology

## 2015-07-23 VITALS — BP 132/68 | HR 62 | Resp 16 | Ht 70.0 in | Wt 224.0 lb

## 2015-07-23 DIAGNOSIS — R269 Unspecified abnormalities of gait and mobility: Secondary | ICD-10-CM | POA: Diagnosis not present

## 2015-07-23 DIAGNOSIS — I480 Paroxysmal atrial fibrillation: Secondary | ICD-10-CM

## 2015-07-23 NOTE — Patient Instructions (Addendum)
I believe you have a gait disorder, which likely is due to a combination of things: normal aging, degenerative arthritis of your upper and lower back, and possible atherosclerosis of the blood vessels in your brain, not always hydrating well, deconditioning, and swelling of your legs.   Remember to drink plenty of fluid, eat healthy meals and do not skip any meals. Try to eat protein with a every meal and eat a healthy snack such as fruit or nuts in between meals. Try to keep a regular sleep-wake schedule and try to exercise daily, particularly in the form of walking, 20-30 minutes a day, if you can. Change positions slowly and you should start using a cane.   As far as your medications are concerned, I would like to suggest no new medications. Please limit alcohol to 1 glass at a day, if that much. Use a cane as needed for safety.    As far as diagnostic testing: head CT without contrast.

## 2015-07-23 NOTE — Progress Notes (Signed)
Subjective:    Patient ID: Wayne Howell is a 75 y.o. male.  HPI     Star Age, MD, PhD [facility] [address], [address] [place], [place] [address]  Dear Dr. Harrington Challenger,   I saw your patient, Wayne Howell, upon your kind request in my neurologic clinic today for initial consultation of his tremors, concern for parkinsonism. The patient is unaccompanied today. As you know, Wayne Howell is a 75 year old right-handed gentleman with an underlying medical history of A. fib, followed by Dr. Percival Spanish, gout, skin cancer, hypertension, hyperlipidemia, C2 cervical fracture in 1990, history of cervical radiculopathy and cervical myelopathy, status post surgery under Dr. Joya Salm, status post neck surgery, status post tonsillectomy, status post skin cancer surgery, status post cardioversion under Dr. Johnsie Cancel in January 2016, who reports gait and balance problems and you noticed some signs of parkinsonism. He feels that he has had gait and balance problems since 2005. He denies any tremors. He has had some fine motor difficulties and has to look in the mirror when he buttons his shirts. He has had some changes in his posture but nothing drastic. He has low back pain. He has seen Dr. Mina Marble for this and has undergone injection treatment for this with no sustained results. He lives with his wife. His wife has 3 grandchildren and he has 2 grown children. He is retired. He does not drink enough water he admits. He drinks about one half bottles of water per day, maybe 2 at the most. He drinks coffee up to 2 to 2-1/2 cups in the morning, and usually tea with lunch, 1-2 glasses. He drinks alcohol in the form of wine, 2-3 times a week, usually 1-3 glasses at a time. He has noticed that his gait and balance are less good once he has had more than one glass of wine. He has not fallen recently. He does not typically use a cane or a walker. His son sent him a cane and his wife got him a cane as  well but he does not use them. He quit smoking in 1982.he had C2 odontoid fracture in 1990 with C1 and 2 fusion in 1990, then he had C4, 5 and 6 fusion in 2005, both surgeries under Dr. Joya Salm, with stainless steel plate and titanium as well as screws in place. He does not seem to go in and out of A. Fib. He denies apneic breathing pauses while asleep or gasping sensation at night. He does have a history of snoring. He sometimes sleeps in a recliner with his feet on an ottoman.  I reviewed your office note from 07/10/2015, which you kindly included.   His Past Medical History Is Significant For: Past Medical History  Diagnosis Date  . Gout   . Skin cancer   . Hypertension   . Atrial fibrillation ([place])   . Hyperlipidemia   . Gilbert's disease   . C2 cervical fracture ([place])     His Past Surgical History Is Significant For: Past Surgical History  Procedure Laterality Date  . Cervical disc arthroplasty      x 2  . Tonsillectomy    . Mohs surgery    . Cardioversion N/A 05/16/2014    Procedure: CARDIOVERSION;  Surgeon: [name], MD;  Location: [facility] ENDOSCOPY;  Service: Cardiovascular;  Laterality: N/A;  . Prostate biopsy      His Family History Is Significant For: Family History  Problem Relation Age of Onset  . Cancer  Mother     Breast  . Heart attack Father 44    Died age 15 of MI    His Social History Is Significant For: Social History   Social History  . Marital Status: Married    Spouse Name: N/A  . Number of Children: 2  . Years of Education: 12   Occupational History  . Retired     Social History Main Topics  . Smoking status: Former Smoker    Types: Cigarettes  . Smokeless tobacco: Never Used     Comment: Quit 33 years ago.  . Alcohol Use: 1.8 oz/week    3 Standard drinks or equivalent per week  . Drug Use: No  . Sexual Activity: Not Asked   Other Topics Concern  . None   Social History Narrative   Lives at home with wife.  He has 3 step children.     Drinks 2 cups of coffee a day and 2 cups of tea a day     His Allergies Are:  Allergies  Allergen Reactions  . Codeine Nausea And Vomiting  :   His Current Medications Are:  Outpatient Encounter Prescriptions as of 07/23/2015  Medication Sig  . acetaminophen (TYLENOL) 500 MG tablet Take 500-1,000 mg by mouth every 6 (six) hours as needed for moderate pain.  Marland Kitchen colchicine 0.6 MG tablet   . CRESTOR 5 MG tablet Take 5 mg by mouth daily.   Marland Kitchen lisinopril (PRINIVIL,ZESTRIL) 20 MG tablet Take 20 mg by mouth daily.  . rivaroxaban (XARELTO) 20 MG TABS tablet Take 1 tablet (20 mg total) by mouth daily.  . tamsulosin (FLOMAX) 0.4 MG CAPS capsule Take 0.4 mg by mouth daily.    No facility-administered encounter medications on file as of 07/23/2015.  : Review of Systems:  Out of a complete 14 point review of systems, all are reviewed and negative with the exception of these symptoms as listed below:  Review of Systems  Neurological:       Patient reports that since his cervical fusion he has had a gait/balance problem. Stumbles a lot but denies any falls.  Denies any tremors or weakness.     Objective:  Neurologic Exam  Physical Exam Physical Examination:   Filed Vitals:   07/23/15 1457  BP: 132/68  Pulse: 62  Resp: 16    General Examination: The patient is a very pleasant 75 y.o. male in no acute distress. He appears well-developed and well-nourished and well groomed.   HEENT: Normocephalic, atraumatic, pupils are equal, round and reactive to light and accommodation. Funduscopic exam is difficult but does not show any obvious abnormalities. He has bilateral cataracts. Extraocular tracking is good without limitation to gaze excursion or nystagmus noted. Normal smooth pursuit is noted. Hearing is grossly intact. Face is symmetric with normal facial animation and normal facial sensation. Speech is clear with no dysarthria noted. There is no hypophonia. There is no lip, neck/head, jaw or  voice tremor. Neck is fused with decreased range of motion, slightly forward tilt with his neck. There are no carotid bruits on auscultation. Oropharynx exam reveals: moderate mouth dryness, adequate dental hygiene and mild airway crowding, due to redundant soft palate and smaller airway entry. Mallampati is class II. Tongue protrudes centrally and palate elevates symmetrically.   Chest: Clear to auscultation without wheezing, rhonchi or crackles noted.  Heart: S1+S2+0, regular and normal without murmurs, rubs or gallops noted.   Abdomen: Soft, non-tender and non-distended with normal bowel sounds appreciated on  auscultation.  Extremities: There is 1+ pitting edema in the distal lower extremities bilaterally, R>L. Pedal pulses are intact.  Skin: Warm and dry without trophic changes noted. There are no varicose veins.  Musculoskeletal: exam reveals no obvious joint deformities, tenderness or joint swelling or erythema.   Neurologically:  Mental status: The patient is awake, alert and oriented in all 4 spheres. His immediate and remote memory, attention, language skills and fund of knowledge are appropriate. There is no evidence of aphasia, agnosia, apraxia or anomia. Speech is clear with normal prosody and enunciation. Thought process is linear. Mood is normal and affect is normal.  Cranial nerves II - XII are as described above under HEENT exam. In addition: shoulder shrug is normal with equal shoulder height noted. Motor exam: Normal bulk, strength and tone is noted. There is no drift, tremor or rebound. Romberg is negative. Reflexes are 1+ throughout, except absent in the ankles. Fine motor skills and coordination: intact with normal finger taps, normal hand movements, normal rapid alternating patting, normal foot taps and normal foot agility.  Cerebellar testing: No dysmetria or intention tremor on finger to nose testing. Heel to shin is unremarkable bilaterally. There is no truncal or gait  ataxia.  Sensory exam: intact to light touch, pinprick, vibration, temperature sense in the upper and lower extremities.  Gait, station and balance: He stands withno significant difficulty but does push himself up. His posture is mildly stooped, with increase in lumbar kyphosis. He also has a forward tilt and his neck. he walks slightly cautiously with smaller steps but good pace and preserved arm swing bilaterally. He turns slightly insecurely. Tandem walk is not possible for him.   Assessment and Plan:   In summary, Arun Baggarly Maycock is a very pleasant 75 y.o.-year old male with an underlying medical history of A. fib, followed by Dr. Percival Spanish, gout, skin cancer, hypertension, hyperlipidemia, C2 cervical fracture in 1990, history of cervical radiculopathy and cervical myelopathy, status post surgery under Dr. Joya Salm, status post neck surgery, status post tonsillectomy, status post skin cancer surgery, status post cardioversion under Dr. Johnsie Cancel in January 2016, who reports gait and balance problems, of several years duration. On examination, he has a gait disorder which is not particularly in keeping with parkinsonian gait. Overall, he does not have any telltale signs of parkinsonism. He does have a gait disorder and balance problems which are most likely secondary to multiple players including normal aging, degenerative upper and lower back disease, not always hydrating well, variable alcohol intake, and deconditioning as he has not been exercising regularly as well as lower extremity swelling and overall normal aging. I talked to the patient at length about this. He is reassured that at this point I do not detect any overt signs of parkinsonism. I would be happy to recheck on this during a routine follow-up appointments in about 3 months or so. He is advised to drink more water, limit his alcohol intake to one glass a day if that, consider using a cane for gait safety, exercise regularly in the form of  walking, monitor his lower extremity swelling, elevate legs when sedentary. We will go ahead and schedule a head CT without contrast, since he does have some respect her sugar stroke. He is agreeable. We will call him with his test results.  I answered all his questions today and the patient was in agreement with the above outlined plan. Thank you very much for allowing me to participate in the care of this  nice patient. If I can be of any further assistance to you please do not hesitate to call me at [phone].  Sincerely,   Star Age, MD, PhD

## 2015-07-26 ENCOUNTER — Other Ambulatory Visit: Payer: Commercial Managed Care - HMO

## 2015-07-29 ENCOUNTER — Ambulatory Visit
Admission: RE | Admit: 2015-07-29 | Discharge: 2015-07-29 | Disposition: A | Discharge status: Home / Self Care | Payer: Medicare Other | Source: Ambulatory Visit | Attending: Neurology | Admitting: Neurology

## 2015-07-29 ENCOUNTER — Ambulatory Visit
Admission: RE | Admit: 2015-07-29 | Discharge: 2015-07-29 | Disposition: A | Discharge status: Home / Self Care | Payer: Medicare Other | Source: Ambulatory Visit | Attending: Family Medicine | Admitting: Family Medicine

## 2015-07-29 DIAGNOSIS — I6523 Occlusion and stenosis of bilateral carotid arteries: Secondary | ICD-10-CM | POA: Diagnosis not present

## 2015-07-29 DIAGNOSIS — I480 Paroxysmal atrial fibrillation: Secondary | ICD-10-CM

## 2015-07-29 DIAGNOSIS — I779 Disorder of arteries and arterioles, unspecified: Secondary | ICD-10-CM

## 2015-07-29 DIAGNOSIS — R269 Unspecified abnormalities of gait and mobility: Secondary | ICD-10-CM | POA: Diagnosis not present

## 2015-07-29 DIAGNOSIS — I739 Peripheral vascular disease, unspecified: Principal | ICD-10-CM

## 2015-07-30 NOTE — Progress Notes (Signed)
Quick Note:  Please call patient regarding the recent head CT without contrast: The brain scan showed a normal structure of the brain and mild volume loss which we call atrophy, probably in keeping with his age. There were changes in the deeper structures of the brain, which we call white matter changes or microvascular changes. These were reported as mild in His case, also not unusual for his age. These are tiny white spots, that occur with time and are seen in a variety of conditions, including with normal aging, chronic hypertension, chronic headaches, especially migraine HAs, chronic diabetes, chronic hyperlipidemia. These are not strokes and no mass or lesion were seen which is reassuring. Again, there were no acute findings, such as a stroke, or mass or blood products. No further action is required on this test at this time, other than re-enforcing the importance of good blood pressure control, good cholesterol control, good blood sugar control, and weight management. Please remind patient to keep any upcoming appointments or tests and to call us with any interim questions, concerns, problems or updates. Thanks,  Star Age, MD, PhD    ______

## 2015-07-31 ENCOUNTER — Telehealth: Payer: Self-pay

## 2015-07-31 NOTE — Telephone Encounter (Signed)
I spoke to patient and he is aware of results and recommendations. He voiced understanding. I reminded him of his next appt time.

## 2015-07-31 NOTE — Telephone Encounter (Signed)
-----   Message from Star Age, MD sent at 07/30/2015  2:07 PM EDT ----- Please call patient regarding the recent head CT without contrast: The brain scan showed a normal structure of the brain and mild volume loss which we call atrophy, probably in keeping with his age. There were changes in the deeper structures of the brain, which we call white matter changes or microvascular changes. These were reported as mild in His case, also not unusual for his age. These are tiny white spots, that occur with time and are seen in a variety of conditions, including with normal aging, chronic hypertension, chronic headaches, especially migraine HAs, chronic diabetes, chronic hyperlipidemia. These are not strokes and no mass or lesion were seen which is reassuring. Again, there were no acute findings, such as a stroke, or mass or blood products. No further action is required on this test at this time, other than re-enforcing the importance of good blood pressure control, good cholesterol control, good blood sugar control, and weight management. Please remind patient to keep any upcoming appointments or tests and to call us with any interim questions, concerns, problems or updates. Thanks,  Star Age, MD, PhD

## 2015-08-22 DIAGNOSIS — Z85828 Personal history of other malignant neoplasm of skin: Secondary | ICD-10-CM | POA: Diagnosis not present

## 2015-08-22 DIAGNOSIS — L570 Actinic keratosis: Secondary | ICD-10-CM | POA: Diagnosis not present

## 2015-10-10 DIAGNOSIS — L820 Inflamed seborrheic keratosis: Secondary | ICD-10-CM | POA: Diagnosis not present

## 2015-10-19 ENCOUNTER — Other Ambulatory Visit: Payer: Self-pay | Admitting: Cardiology

## 2015-10-23 ENCOUNTER — Ambulatory Visit (INDEPENDENT_AMBULATORY_CARE_PROVIDER_SITE_OTHER): Payer: Medicare Other | Admitting: Neurology

## 2015-10-23 ENCOUNTER — Encounter: Payer: Self-pay | Admitting: Neurology

## 2015-10-23 VITALS — BP 138/62 | HR 62 | Resp 18 | Ht 70.0 in | Wt 224.0 lb

## 2015-10-23 DIAGNOSIS — R269 Unspecified abnormalities of gait and mobility: Secondary | ICD-10-CM

## 2015-10-23 NOTE — Progress Notes (Signed)
Subjective:    Patient ID: Wayne Howell is a 75 y.o. male.  HPI     Interim history:   Wayne Howell is a 75 year old right-handed gentleman with an underlying medical history of A. Fib (followed by Wayne Howell), gout, skin cancer, hypertension, hyperlipidemia, C2 cervical fracture in 1990, history of cervical radiculopathy and cervical myelopathy (s/p surgery under Wayne Howell), status post tonsillectomy, status post skin cancer surgery, status post cardioversion under Wayne Howell in January 2016, who presents for follow-up consultation of his gait disorder and balance problems. The patient is unaccompanied today. I first met him on 07/23/2015 at the request of his primary care physician, at which time he reported gait and balance problems and possible parkinsonism was noted by Wayne Howell. I did not note much in the way of overt parkinsonism, but I felt that he did have a gait disorder, most likely due to multiple factors including degenerative spine disease, swelling of the legs, possibly not always hydrating well enough, overall deconditioning, and I suggested a head CT without contrast. Furthermore, I suggested he increase his water intake, limit his alcohol intake to one serving per day, and consider using a cane for gait safety.   He had a head CT without contrast on 07/29/2015: IMPRESSION:  This CT scan of the head without contrast shows the following: 1.   Mild cortical atrophy.   The extent is typical for age. 2.   Hyperdense focus in the left parietal lobe consistent with mild chronic microvascular ischemic change. 3.   There are no acute findings. In addition, I personally reviewed the images through the PACS system.   We called him with his test results.  Today, 10/23/2015: He reports doing well, no recent falls, has been using a walking stick some, and using a stationary bike some, almost daily. Tries to hydrate better, but still not enough water: 1 cup of coffee in AM, less than 20 oz  of water, tea with lunch and water at night, less alcohol now, not daily. He had an interim carotid Doppler study on 07/29/2015 and I reviewed the report: He had less than 50% stenosis of the right and left internal carotid artery. Weight is stable, is using some weights to help his upper body strength.   Previously:   07/23/2015: He reports gait and balance problems and you noticed some signs of parkinsonism. He feels that he has had gait and balance problems since 2005. He denies any tremors. He has had some fine motor difficulties and has to look in the mirror when he buttons his shirts. He has had some changes in his posture but nothing drastic. He has low back pain. He has seen Wayne Howell for this and has undergone injection treatment for this with no sustained results. He lives with his wife. His wife has 3 grandchildren and he has 2 grown children. He is retired. He does not drink enough water he admits. He drinks about one half bottles of water per day, maybe 2 at the most. He drinks coffee up to 2 to 2-1/2 cups in the morning, and usually tea with lunch, 1-2 glasses. He drinks alcohol in the form of wine, 2-3 times a week, usually 1-3 glasses at a time. He has noticed that his gait and balance are less good once he has had more than one glass of wine. He has not fallen recently. He does not typically use a cane or a walker. His son sent him a cane and his  wife got him a cane as well but he does not use them. He quit smoking in 1982.he had C2 odontoid fracture in 1990 with C1 and 2 fusion in 1990, then he had C4, 5 and 6 fusion in 2005, both surgeries under Wayne Howell, with stainless steel plate and titanium as well as screws in place. He does not seem to go in and out of A. Fib. He denies apneic breathing pauses while asleep or gasping sensation at night. He does have a history of snoring. He sometimes sleeps in a recliner with his feet on an ottoman.  I reviewed your office note from 07/10/2015, which  you kindly included.  His Past Medical History Is Significant For: Past Medical History  Diagnosis Date  . Gout   . Skin cancer   . Hypertension   . Atrial fibrillation ([place])   . Hyperlipidemia   . Gilbert's disease   . C2 cervical fracture ([place])     His Past Surgical History Is Significant For: Past Surgical History  Procedure Laterality Date  . Cervical disc arthroplasty      x 2  . Tonsillectomy    . Mohs surgery    . Cardioversion N/A 05/16/2014    Procedure: CARDIOVERSION;  Surgeon: [name], MD;  Location: [facility] ENDOSCOPY;  Service: Cardiovascular;  Laterality: N/A;  . Prostate biopsy      His Family History Is Significant For: Family History  Problem Relation Age of Onset  . Cancer Mother     Breast  . Heart attack Father 15    Died age 62 of MI    His Social History Is Significant For: Social History   Social History  . Marital Status: Married    Spouse Name: N/A  . Number of Children: 2  . Years of Education: 12   Occupational History  . Retired     Social History Main Topics  . Smoking status: Former Smoker    Types: Cigarettes  . Smokeless tobacco: Never Used     Comment: Quit 33 years ago.  . Alcohol Use: 1.8 oz/week    3 Standard drinks or equivalent per week  . Drug Use: No  . Sexual Activity: Not Asked   Other Topics Concern  . None   Social History Narrative   Lives at home with wife.  He has 3 step children.    Drinks 2 cups of coffee a day and 2 cups of tea a day     His Allergies Are:  Allergies  Allergen Reactions  . Codeine Nausea And Vomiting  :   His Current Medications Are:  Outpatient Encounter Prescriptions as of 10/23/2015  Medication Sig  . acetaminophen (TYLENOL) 500 MG tablet Take 500-1,000 mg by mouth every 6 (six) hours as needed for moderate pain.  Marland Kitchen colchicine 0.6 MG tablet   . CRESTOR 5 MG tablet Take 5 mg by mouth daily.   Marland Kitchen lisinopril (PRINIVIL,ZESTRIL) 20 MG tablet Take 20 mg by mouth daily.  .  tamsulosin (FLOMAX) 0.4 MG CAPS capsule Take 0.4 mg by mouth daily.   Alveda Reasons 20 MG TABS tablet TAKE 1 TABLET(20 MG) BY MOUTH DAILY   No facility-administered encounter medications on file as of 10/23/2015.  :  Review of Systems:  Out of a complete 14 point review of systems, all are reviewed and negative with the exception of these symptoms as listed below:  Review of Systems  Neurological:       Patient is here  for f/u. No new concerns.     Objective:  Neurologic Exam  Physical Exam  Physical Examination:   Filed Vitals:   10/23/15 1026  BP: 138/62  Pulse: 62  Resp: 18   General Examination: The patient is a very pleasant 75 y.o. male in no acute distress. He appears well-developed and well-nourished and well groomed. He is in good spirits today.  HEENT: Normocephalic, atraumatic, pupils are equal, round and reactive to light and accommodation. Funduscopic exam is difficult but does not show any obvious abnormalities, stable appearing bilateral cataracts. Extraocular tracking is good without limitation to gaze excursion or nystagmus noted. Normal smooth pursuit is noted. Hearing is grossly intact. Face is symmetric with normal facial animation and normal facial sensation. Speech is clear with no dysarthria noted. There is no hypophonia. There is no lip, neck/head, jaw or voice tremor. Neck is fused with decreased range of motion, slightly forward tilt with his neck. There are no carotid bruits on auscultation. Oropharynx exam reveals: moderate mouth dryness, adequate dental hygiene and mild airway crowding, due to redundant soft palate and smaller airway entry. Mallampati is class II. Tongue protrudes centrally and palate elevates symmetrically.   Chest: Clear to auscultation without wheezing, rhonchi or crackles noted.  Heart: S1+S2+0, regular and normal without murmurs, rubs or gallops noted.   Abdomen: Soft, non-tender and non-distended with normal bowel sounds appreciated on  auscultation.  Extremities: There is mild edema in the distal lower extremities bilaterally. Pedal pulses are intact.  Skin: Warm and dry without trophic changes noted. There are no varicose veins.  Musculoskeletal: exam reveals no obvious joint deformities, tenderness or joint swelling or erythema.   Neurologically:  Mental status: The patient is awake, alert and oriented in all 4 spheres. His immediate and remote memory, attention, language skills and fund of knowledge are appropriate. There is no evidence of aphasia, agnosia, apraxia or anomia. Speech is clear with normal prosody and enunciation. Thought process is linear. Mood is normal and affect is normal.  Cranial nerves II - XII are as described above under HEENT exam. In addition: shoulder shrug is normal with equal shoulder height noted. Motor exam: Normal bulk, strength and tone is noted. There is no drift, tremor or rebound. Romberg is negative. Reflexes are 1+ throughout, except absent in the ankles. Fine motor skills and coordination: intact with normal finger taps, normal hand movements, normal rapid alternating patting, normal foot taps and normal foot agility.  Cerebellar testing: No dysmetria or intention tremor on finger to nose testing. Heel to shin is unremarkable bilaterally. There is no truncal or gait ataxia.  Sensory exam: intact to light touch in the upper and lower extremities.  Gait, station and balance: He stands without significant difficulty but does push himself up and reports lower back stiffness. His posture is mildly stooped, with increase in lumbar kyphosis. He also has a forward tilt and his neck. he walks slightly cautiously with smaller steps but good pace and preserved arm swing bilaterally. He turns slightly insecurely. Tandem walk is not possible for him.   Assessment and Plan:   In summary, Wayne Howell is a very pleasant 75 year old male with an underlying medical history of A. fib, followed by Dr.  [name], gout, skin cancer, hypertension, hyperlipidemia, C2 cervical fracture in 1990, history of cervical radiculopathy and cervical myelopathy, status post surgery under Wayne Howell in 2005, status post cardioversion under Wayne Howell in January 2016, who reports gait and balance problems, of several years  duration. On examination, he has a gait disorder which is not particularly in keeping with parkinsonian gait. Overall, he does not have any telltale signs of parkinsonism and his exam remained stable. We talked about hydration again today. I think he can improve his hydration a little bit more. He has cut back on his alcohol intake and is doing more exercise for which I commended him. Overall, his exam remains fairly nonfocal otherwise. He does have an abnormal posture and decrease in range of motion in his upper spine and lumbar spine. He has also been doing some weight lifting gently with dumbbells at home. He has been using a stationary bike. I think this will help overall muscle conditioning. He has been using a walking stick as needed. I asked him to continue to work on physical conditioning, walking exercises, gentle weightlifting for conditioning, better hydration. We talked about his recent carotid Doppler test results which were reassuring and his head CT results from April 2017 which were also reassuring and mostly in keeping with age-appropriate changes. I suggested an as needed follow-up at this juncture. I answered all his questions today and he was in agreement.  I spent 25 minutes in total face-to-face time with the patient, more than 50% of which was spent in counseling and coordination of care, reviewing test results, reviewing medication and discussing or reviewing the diagnosis of gait d/o, its prognosis and treatment options.

## 2015-10-23 NOTE — Patient Instructions (Addendum)
Your exam is stable and we can follow up as needed.   Please work on your exercise regimen and increase your water intake. Use a cane for gait safety.

## 2015-11-19 NOTE — Progress Notes (Signed)
   HPI The patient presents for evaluation of  atrial fibrillation. He is status post cardioversion.  He returns for yearly follow up.  Since I last saw him he has done well.  The patient denies any new symptoms such as chest discomfort, neck or arm discomfort. There has been no new shortness of breath, PND or orthopnea. There have been no reported palpitations, presyncope or syncope.  He gets around slowly with some balance problems.  However, he walks at [place] and he rides an exercise bike.  He does report some vague episodes of SOB after getting up to go to the bathroom at night.  However, he recovers very quickly.    Allergies  Allergen Reactions  . Codeine Nausea And Vomiting    Current Outpatient Prescriptions  Medication Sig Dispense Refill  . acetaminophen (TYLENOL) 500 MG tablet Take 500-1,000 mg by mouth every 6 (six) hours as needed for moderate pain.    Marland Kitchen colchicine 0.6 MG tablet   0  . CRESTOR 5 MG tablet Take 5 mg by mouth daily.     Marland Kitchen lisinopril (PRINIVIL,ZESTRIL) 20 MG tablet Take 20 mg by mouth daily.    . tamsulosin (FLOMAX) 0.4 MG CAPS capsule Take 0.4 mg by mouth daily.     Alveda Reasons 20 MG TABS tablet TAKE 1 TABLET(20 MG) BY MOUTH DAILY 30 tablet 5   No current facility-administered medications for this visit.     Past Medical History:  Diagnosis Date  . Atrial fibrillation ([place])   . C2 cervical fracture ([place])   . Gilbert's disease   . Gout   . Hyperlipidemia   . Hypertension   . Skin cancer     Past Surgical History:  Procedure Laterality Date  . CARDIOVERSION N/A 05/16/2014   Procedure: CARDIOVERSION;  Surgeon: [name], MD;  Location: [facility] ENDOSCOPY;  Service: Cardiovascular;  Laterality: N/A;  . CERVICAL DISC ARTHROPLASTY     x 2  . MOHS SURGERY    . PROSTATE BIOPSY    . TONSILLECTOMY      ROS:  Limited by lower back.  Otherwise as stated in the HPI and negative for all other systems.    PHYSICAL EXAM BP 128/60   Pulse (!) 53   Ht 5\' 10"   (1.778 m)   Wt 225 lb (102.1 kg)   BMI 32.28 kg/m   GENERAL:  Well appearing NECK:  No jugular venous distention, waveform within normal limits, carotid upstroke brisk and symmetric, no bruits, no thyromegaly LUNGS:  Clear to auscultation bilaterally BACK:  No CVA tenderness CHEST:  Unremarkable HEART:  PMI not displaced or sustained,S1 and S2 within normal limits, no S3, no clicks, no rubs, no murmurs, irregular ABD:  Flat, positive bowel sounds normal in frequency in pitch, no bruits, no rebound, no guarding, no midline pulsatile mass, no hepatomegaly, no splenomegaly EXT:  2 plus pulses throughout, no edema, no cyanosis no clubbing   EKG:  NSR, rate 53, axis WNL, no acute ST T wave changes.  11/21/2015  ASSESSMENT AND PLAN  ATRIAL FIB:  Mr. RADIN BUKHARI has a CHA2DS2 - VASc score of 3 with a risk of stroke of 3.2%. He tolerates anticoagulation. No change in therapy is indicated.   HTN:  The blood pressure is at target. No change in medications is indicated. We will continue with therapeutic lifestylear changes (TLC).

## 2015-11-21 ENCOUNTER — Ambulatory Visit (INDEPENDENT_AMBULATORY_CARE_PROVIDER_SITE_OTHER): Payer: Medicare Other | Admitting: Cardiology

## 2015-11-21 ENCOUNTER — Encounter: Payer: Self-pay | Admitting: Cardiology

## 2015-11-21 VITALS — BP 128/60 | HR 53 | Ht 70.0 in | Wt 225.0 lb

## 2015-11-21 DIAGNOSIS — I4819 Other persistent atrial fibrillation: Secondary | ICD-10-CM

## 2015-11-21 DIAGNOSIS — I481 Persistent atrial fibrillation: Secondary | ICD-10-CM | POA: Diagnosis not present

## 2015-11-21 NOTE — Patient Instructions (Signed)
Medication Instructions:  Continue current medications  Labwork: NONE  Testing/Procedures: NONE  Follow-Up: Your physician wants you to follow-up in: 1 Year. You will receive a reminder letter in the mail two months in advance. If you don't receive a letter, please call our office to schedule the follow-up appointment.   Any Other Special Instructions Will Be Listed Below (If Applicable).   If you need a refill on your cardiac medications before your next appointment, please call your pharmacy.   

## 2016-02-17 ENCOUNTER — Ambulatory Visit (INDEPENDENT_AMBULATORY_CARE_PROVIDER_SITE_OTHER): Payer: Medicare Other | Admitting: Physician Assistant

## 2016-02-17 ENCOUNTER — Telehealth: Payer: Self-pay | Admitting: Cardiology

## 2016-02-17 ENCOUNTER — Encounter: Payer: Self-pay | Admitting: Physician Assistant

## 2016-02-17 VITALS — BP 136/82 | HR 107 | Ht 70.0 in | Wt 220.6 lb

## 2016-02-17 DIAGNOSIS — E785 Hyperlipidemia, unspecified: Secondary | ICD-10-CM

## 2016-02-17 DIAGNOSIS — I4891 Unspecified atrial fibrillation: Secondary | ICD-10-CM | POA: Diagnosis not present

## 2016-02-17 DIAGNOSIS — I739 Peripheral vascular disease, unspecified: Secondary | ICD-10-CM

## 2016-02-17 DIAGNOSIS — I10 Essential (primary) hypertension: Secondary | ICD-10-CM | POA: Diagnosis not present

## 2016-02-17 DIAGNOSIS — I779 Disorder of arteries and arterioles, unspecified: Secondary | ICD-10-CM

## 2016-02-17 DIAGNOSIS — I480 Paroxysmal atrial fibrillation: Secondary | ICD-10-CM | POA: Diagnosis not present

## 2016-02-17 MED ORDER — PROPRANOLOL HCL 20 MG PO TABS: ORAL_TABLET | ORAL | 2 refills | Status: DC

## 2016-02-17 NOTE — Telephone Encounter (Signed)
Pt of Dr. Percival Spanish w A Fib hx.  Returned call. Notes he woke w A Fib this AM. States his baseline HR typically about 50, he has a hx of A Fib including cardioversion earlier this year. Denies SOB at rest, "maybe a little when walking" but this is not new.  Notes no symptoms other than "a feeling of euphoria" which he associates w prior episodes of A Fib as well. We discussed, no openings on Dr. Rosezella Florida schedule, pt amenable to PA appt.I spoke w Lars Pinks RN about adding to [employer] today - patient has not been seen in A Fib clinic before.  Pt aware of appt info - added for visit today at 11am.

## 2016-02-17 NOTE — Progress Notes (Signed)
Cardiology Office Note    Date:  02/17/2016   ID:  Wayne Howell, DOB [birth year], MRN [id number]  PCP:  Melinda Crutch, MD  Cardiologist: Dr. Percival Spanish    Chief Complaint  Patient presents with  . Follow-up    seen for Dr. Percival Spanish, add-on for recurrent atrial fibrillation    History of Present Illness:  Wayne Howell is a 75 y.o. male with PMH of atrial fibrillation, carotid artery disease, Gilbert's disease, gout, hyperlipidemia and hypertension. He had a negative Myoview in 2006. Echocardiogram obtained on 04/18/2014 showed ejection fraction 55-60%, moderately dilated left atrium. He did undergo DCCV on Xarelto on 05/16/2014 with successful conversion to sinus rhythm after 2 shocks (120 J then 200 J). He was last seen by Dr. Percival Spanish on 11/21/2015 at which time he was doing well, exercising regularly, without any recurrence of atrial fibrillation.   Patient called cardiology office this morning on 02/17/2016 with complaint of waking up in atrial fibrillation. He denies any dizziness, chest pain, shortness breath, he has no symptoms other than some mild palpitation. He denies any recent stress factors including fever, chill, or cough. His last TSH in 2016 was low, however free T4 was normal. He does not drink excessive amount of alcohol. He is not on any rate control medications, as his previous baseline heart rate was in the high 40s to 50s.    Past Medical History:  Diagnosis Date  . Atrial fibrillation ([place])   . C2 cervical fracture ([place])   . Gilbert's disease   . Gout   . Hyperlipidemia   . Hypertension   . Skin cancer     Past Surgical History:  Procedure Laterality Date  . CARDIOVERSION N/A 05/16/2014   Procedure: CARDIOVERSION;  Surgeon: [name], MD;  Location: [facility] ENDOSCOPY;  Service: Cardiovascular;  Laterality: N/A;  . CERVICAL DISC ARTHROPLASTY     x 2  . MOHS SURGERY    . PROSTATE BIOPSY    . TONSILLECTOMY      Current Medications: Outpatient Medications  Prior to Visit  Medication Sig Dispense Refill  . acetaminophen (TYLENOL) 500 MG tablet Take 500-1,000 mg by mouth every 6 (six) hours as needed for moderate pain.    Marland Kitchen colchicine 0.6 MG tablet Take 0.6 mg by mouth as needed. gout  0  . CRESTOR 5 MG tablet Take 5 mg by mouth daily.     Marland Kitchen lisinopril (PRINIVIL,ZESTRIL) 20 MG tablet Take 20 mg by mouth daily.    . tamsulosin (FLOMAX) 0.4 MG CAPS capsule Take 0.4 mg by mouth daily.     Alveda Reasons 20 MG TABS tablet TAKE 1 TABLET(20 MG) BY MOUTH DAILY 30 tablet 5   No facility-administered medications prior to visit.      Allergies:   Codeine   Social History   Social History  . Marital status: Married    Spouse name: N/A  . Number of children: 2  . Years of education: 12   Occupational History  . Retired     Social History Main Topics  . Smoking status: Former Smoker    Types: Cigarettes  . Smokeless tobacco: Never Used     Comment: Quit 33 years ago.  . Alcohol use 1.8 oz/week    3 Standard drinks or equivalent per week  . Drug use: No  . Sexual activity: Not Asked   Other Topics Concern  . None   Social History Narrative   Lives at home with wife.  He has 3 step children.    Drinks 2 cups of coffee a day and 2 cups of tea a day      Family History:  The patient's family history includes Cancer in his mother; Heart attack (age of onset: 41) in his father.   ROS:   Please see the history of present illness.    ROS All other systems reviewed and are negative.   PHYSICAL EXAM:   VS:  BP 136/82   Pulse (!) 107   Ht 5\' 10"  (1.778 m)   Wt 220 lb 9.6 oz (100.1 kg)   BMI 31.65 kg/m    GEN: Well nourished, well developed, in no acute distress  HEENT: normal  Neck: no JVD, carotid bruits, or masses Cardiac: irregularly irregular; no murmurs, rubs, or gallops,no edema  Respiratory:  clear to auscultation bilaterally, normal work of breathing GI: soft, nontender, nondistended, + BS MS: no deformity or atrophy  Skin:  warm and dry, no rash Neuro:  Alert and Oriented x 3, Strength and sensation are intact Psych: euthymic mood, full affect  Wt Readings from Last 3 Encounters:  02/17/16 220 lb 9.6 oz (100.1 kg)  11/21/15 225 lb (102.1 kg)  10/23/15 224 lb (101.6 kg)      Studies/Labs Reviewed:   EKG:  EKG is ordered today.  The ekg ordered today demonstrates afib with HR 107  Recent Labs: No results found for requested labs within last 8760 hours.   Lipid Panel No results found for: CHOL, TRIG, HDL, CHOLHDL, VLDL, LDLCALC, LDLDIRECT  Additional studies/ records that were reviewed today include:   Myoview 07/30/2014 IMPRESSION:   1. Ejection fraction 47 percent.   2. No stress induced ischemia.   3. Fixed thinning of the inferior wall with associated wall motion abnormality compatible with a fixed perfusion defect.     Echo 04/18/2014 LV EF: 55% -  60%  Study Conclusions  - Left ventricle: The cavity size was normal. Wall thickness was normal. Systolic function was normal. The estimated ejection fraction was in the range of 55% to 60%. - Aortic valve: There was trivial regurgitation. - Left atrium: The atrium was moderately dilated. - Atrial septum: No defect or patent foramen ovale was identified. - Pericardium, extracardiac: A trivial pericardial effusion was identified posterior to the heart.  ASSESSMENT:    1. Paroxysmal atrial fibrillation (HCC)   2. Bilateral carotid artery disease ([place])   3. Essential hypertension   4. Hyperlipidemia, unspecified hyperlipidemia type      PLAN:  In order of problems listed above:  1. Paroxysmal atrial fibrillation on Xarelto  - He has been compliant with her Xarelto, he is not on any rate control medication as his previous baseline heart rate was in the high 40s to low 50s range. I am hesitant to put him on a scheduled medication.   - Will place him on short-acting propranolol 20mg  TID PRN dosing. Hopefully with additional rate  control, he will convert out by himself.  - We will always refer the patient to atrial fibrillation clinic to discuss other options. If he is still in atrial fibrillation on follow-up, then we can potentially consider outpatient cardioversion and also echocardiogram to assess the LA size. Note his previous echocardiogram in 2015 showed mild to moderate LA enlargement.  - Obtain TSH and free T4.  2. Bilateral carotid artery disease: Less than 50% stenosis in bilateral ICA on Doppler 07/29/2015  3. Hypertension: On lisinopril, systolic blood pressure [id number]. Enough blood  pressure room to add short-acting propranolol as needed for rate control.  4. Hyperlipidemia: On Crestor 5 mg daily.    Medication Adjustments/Labs and Tests Ordered: Current medicines are reviewed at length with the patient today.  Concerns regarding medicines are outlined above.  Medication changes, Labs and Tests ordered today are listed in the Patient Instructions below. Patient Instructions  Medication Instructions:  Your physician has recommended you make the following change in your medication:  1-  Propranolol 20 mg  (1 tablet) by mouth three times a day as needed.    Labwork: Your physician recommends that you return for lab work in: [place].   Testing/Procedures: NONE  Follow-Up: You have been referred to A. FIB. CLINIC FOR EVALUATION.   If you need a refill on your cardiac medications before your next appointment, please call your pharmacy.      Hilbert Corrigan, [place]  02/17/2016 12:32 PM    [facility] [address], [place], [place]  [address] Phone: [phone]; Fax: [phone]

## 2016-02-17 NOTE — Patient Instructions (Signed)
Medication Instructions:  Your physician has recommended you make the following change in your medication:  1-  Propranolol 20 mg  (1 tablet) by mouth three times a day as needed.    Labwork: Your physician recommends that you return for lab work in: [place].   Testing/Procedures: NONE  Follow-Up: You have been referred to A. FIB. CLINIC FOR EVALUATION.   If you need a refill on your cardiac medications before your next appointment, please call your pharmacy.

## 2016-02-17 NOTE — Telephone Encounter (Signed)
Pt woke up in Atrial Fib this morning,says he needs to be seen today please.

## 2016-02-18 ENCOUNTER — Ambulatory Visit (HOSPITAL_COMMUNITY)
Admission: RE | Admit: 2016-02-18 | Discharge: 2016-02-18 | Disposition: A | Discharge status: Home / Self Care | Payer: Medicare Other | Source: Ambulatory Visit | Attending: Nurse Practitioner | Admitting: Nurse Practitioner

## 2016-02-18 ENCOUNTER — Encounter (HOSPITAL_COMMUNITY): Payer: Self-pay | Admitting: Nurse Practitioner

## 2016-02-18 VITALS — BP 116/74 | HR 97 | Ht 70.0 in | Wt 219.8 lb

## 2016-02-18 DIAGNOSIS — I4891 Unspecified atrial fibrillation: Secondary | ICD-10-CM | POA: Diagnosis present

## 2016-02-18 DIAGNOSIS — I480 Paroxysmal atrial fibrillation: Secondary | ICD-10-CM | POA: Insufficient documentation

## 2016-02-18 LAB — T4, FREE: Free T4: 1.3 ng/dL (ref 0.8–1.8)

## 2016-02-18 LAB — TSH: TSH: 0.45 mIU/L (ref 0.40–4.50)

## 2016-02-18 NOTE — Progress Notes (Signed)
Primary Care Physician: Melinda Crutch, MD Referring Physician: [name], PA Cardiologist: Dr. [name] is a 75 y.o. male with a h/o atrial fibrillation, carotid artery disease, Gilbert's disease, gout, hyperlipidemia and hypertension. He had a negative Myoview in 2006. Echocardiogram obtained on 04/18/2014 showed ejection fraction 55-60%, moderately dilated left atrium. He did undergo DCCV on Xarelto on 05/16/2014 with successful conversion to sinus rhythm after 2 shocks (120 J then 200 J). He was last seen by Dr. Percival Spanish on 11/21/2015 at which time he was doing well, exercising regularly, without any recurrence of atrial fibrillation.   He was seen by [name], PA 10/23 after pt called and had complaint of waking up in afib. He was started on Inderal 20 mg tid to help return him to SR. He does run slow in afib usually high 40's to 50's. He is rate controlled today with v rate at 97 bpm. Since starting BB, he feels ok and is planning to leave for the beach later in week and will return 10/31. He has not had any sustained afib since cardioversion 1/16. Has noticed maybe an hour of irregular heart beat very infrequently. He is not consuming large amounts of caffeine or alcohol. Denies snoring/apnea history. Does not exercise on a regular basis due to back issues.  Today, he denies symptoms of palpitations, chest pain, shortness of breath, orthopnea, PND, lower extremity edema, dizziness, presyncope, syncope, or neurologic sequela. The patient is tolerating medications without difficulties and is otherwise without complaint today.   Past Medical History:  Diagnosis Date  . Atrial fibrillation ([place])   . C2 cervical fracture ([place])   . Gilbert's disease   . Gout   . Hyperlipidemia   . Hypertension   . Skin cancer    Past Surgical History:  Procedure Laterality Date  . CARDIOVERSION N/A 05/16/2014   Procedure: CARDIOVERSION;  Surgeon: [name], MD;  Location: [facility]  ENDOSCOPY;  Service: Cardiovascular;  Laterality: N/A;  . CERVICAL DISC ARTHROPLASTY     x 2  . MOHS SURGERY    . PROSTATE BIOPSY    . TONSILLECTOMY      Current Outpatient Prescriptions  Medication Sig Dispense Refill  . acetaminophen (TYLENOL) 500 MG tablet Take 500-1,000 mg by mouth every 6 (six) hours as needed for moderate pain.    Marland Kitchen colchicine 0.6 MG tablet Take 0.6 mg by mouth as needed. gout  0  . CRESTOR 5 MG tablet Take 5 mg by mouth daily.     Marland Kitchen lisinopril (PRINIVIL,ZESTRIL) 20 MG tablet Take 20 mg by mouth daily.    . propranolol (INDERAL) 20 MG tablet Take 1 tablet three times a day AS NEEDED. 90 tablet 2  . tamsulosin (FLOMAX) 0.4 MG CAPS capsule Take 0.4 mg by mouth daily.     Alveda Reasons 20 MG TABS tablet TAKE 1 TABLET(20 MG) BY MOUTH DAILY 30 tablet 5   No current facility-administered medications for this encounter.     Allergies  Allergen Reactions  . Codeine Nausea And Vomiting    Social History   Social History  . Marital status: Married    Spouse name: N/A  . Number of children: 2  . Years of education: 12   Occupational History  . Retired     Social History Main Topics  . Smoking status: Former Smoker    Types: Cigarettes  . Smokeless tobacco: Never Used     Comment: Quit 33 years ago.  Marland Kitchen  Alcohol use 1.8 oz/week    3 Standard drinks or equivalent per week  . Drug use: No  . Sexual activity: Not on file   Other Topics Concern  . Not on file   Social History Narrative   Lives at home with wife.  He has 3 step children.    Drinks 2 cups of coffee a day and 2 cups of tea a day     Family History  Problem Relation Age of Onset  . Cancer Mother     Breast  . Heart attack Father 38    Died age 76 of MI    ROS- All systems are reviewed and negative except as per the HPI above  Physical Exam: Vitals:   02/18/16 1109  BP: 116/74  Pulse: 97  Weight: 219 lb 12.8 oz (99.7 kg)  Height: 5\' 10"  (1.778 m)    GEN- The patient is well  appearing, alert and oriented x 3 today.   Head- normocephalic, atraumatic Eyes-  Sclera clear, conjunctiva pink Ears- hearing intact Oropharynx- clear Neck- supple, no JVP Lymph- no cervical lymphadenopathy Lungs- Clear to ausculation bilaterally, normal work of breathing Heart- irregular rate and rhythm, no murmurs, rubs or gallops, PMI not laterally displaced GI- soft, NT, ND, + BS Extremities- no clubbing, cyanosis, or edema MS- no significant deformity or atrophy Skin- no rash or lesion Psych- euthymic mood, full affect Neuro- strength and sensation are intact  EKG- afib at 97 bpm, qrs int 84 ms, qtc424 ms Epic records reviewed Lab- TSH, free T4 02/17/16- WNL Echo- 2015-Study Conclusions  - Left ventricle: The cavity size was normal. Wall thickness was normal. Systolic function was normal. The estimated ejection fraction was in the range of 55% to 60%. - Aortic valve: There was trivial regurgitation. - Left atrium: The atrium was moderately dilated. - Atrial septum: No defect or patent foramen ovale was identified. - Pericardium, extracardiac: A trivial pericardial effusion was identified posterior to the heart.  Assessment and Plan: 1. Paroxysmal afib Has been in afib x 24 hours Feels better with rate control Continue with propranolol 20 tid, if resumes SR, he is stop drug since he has brady at baseline Continue xarelto, states no missed doses Update echo  Pt wishes to go ahead with plans for his beach trip and will be home after 10/31. Echo scheduled for 11/3 and will f/u in clinic after that. I think with his low afib burden. It is  reasonable to pursue another cardioversion, if afib persists, to restore SR before he is committed to an antiarrythmic    Butch Penny C. Reynalda Canny, [facility] [address] [place], [place] [address] [phone]

## 2016-02-28 ENCOUNTER — Ambulatory Visit (HOSPITAL_COMMUNITY)
Admission: RE | Admit: 2016-02-28 | Discharge: 2016-02-28 | Disposition: A | Discharge status: Home / Self Care | Payer: Medicare Other | Source: Ambulatory Visit | Attending: Nurse Practitioner | Admitting: Nurse Practitioner

## 2016-02-28 DIAGNOSIS — I4891 Unspecified atrial fibrillation: Secondary | ICD-10-CM | POA: Diagnosis present

## 2016-02-28 DIAGNOSIS — I119 Hypertensive heart disease without heart failure: Secondary | ICD-10-CM | POA: Insufficient documentation

## 2016-02-28 DIAGNOSIS — I313 Pericardial effusion (noninflammatory): Secondary | ICD-10-CM | POA: Diagnosis not present

## 2016-02-28 DIAGNOSIS — I480 Paroxysmal atrial fibrillation: Secondary | ICD-10-CM

## 2016-02-28 NOTE — Progress Notes (Signed)
  Echocardiogram 2D Echocardiogram has been performed.  Wayne Howell 02/28/2016, 10:31 AM

## 2016-03-04 ENCOUNTER — Encounter (HOSPITAL_COMMUNITY): Payer: Self-pay | Admitting: Nurse Practitioner

## 2016-03-04 ENCOUNTER — Ambulatory Visit (HOSPITAL_COMMUNITY)
Admission: RE | Admit: 2016-03-04 | Discharge: 2016-03-04 | Disposition: A | Discharge status: Home / Self Care | Payer: Medicare Other | Source: Ambulatory Visit | Attending: Nurse Practitioner | Admitting: Nurse Practitioner

## 2016-03-04 VITALS — BP 134/78 | HR 72 | Ht 70.0 in | Wt 221.8 lb

## 2016-03-04 DIAGNOSIS — M109 Gout, unspecified: Secondary | ICD-10-CM | POA: Diagnosis not present

## 2016-03-04 DIAGNOSIS — Z87891 Personal history of nicotine dependence: Secondary | ICD-10-CM | POA: Insufficient documentation

## 2016-03-04 DIAGNOSIS — I779 Disorder of arteries and arterioles, unspecified: Secondary | ICD-10-CM | POA: Diagnosis not present

## 2016-03-04 DIAGNOSIS — I481 Persistent atrial fibrillation: Secondary | ICD-10-CM | POA: Diagnosis not present

## 2016-03-04 DIAGNOSIS — Z7901 Long term (current) use of anticoagulants: Secondary | ICD-10-CM | POA: Insufficient documentation

## 2016-03-04 DIAGNOSIS — I480 Paroxysmal atrial fibrillation: Secondary | ICD-10-CM | POA: Diagnosis not present

## 2016-03-04 DIAGNOSIS — I10 Essential (primary) hypertension: Secondary | ICD-10-CM | POA: Insufficient documentation

## 2016-03-04 DIAGNOSIS — I4891 Unspecified atrial fibrillation: Secondary | ICD-10-CM | POA: Diagnosis present

## 2016-03-04 DIAGNOSIS — I4819 Other persistent atrial fibrillation: Secondary | ICD-10-CM

## 2016-03-04 DIAGNOSIS — E785 Hyperlipidemia, unspecified: Secondary | ICD-10-CM | POA: Insufficient documentation

## 2016-03-04 DIAGNOSIS — Z79899 Other long term (current) drug therapy: Secondary | ICD-10-CM | POA: Insufficient documentation

## 2016-03-04 LAB — CBC
HCT: 45.3 % (ref 39.0–52.0)
Hemoglobin: 15.6 g/dL (ref 13.0–17.0)
MCH: 31.6 pg (ref 26.0–34.0)
MCHC: 34.4 g/dL (ref 30.0–36.0)
MCV: 91.9 fL (ref 78.0–100.0)
PLATELETS: 120 10*3/uL — AB (ref 150–400)
RBC: 4.93 MIL/uL (ref 4.22–5.81)
RDW: 12.8 % (ref 11.5–15.5)
WBC: 5.2 10*3/uL (ref 4.0–10.5)

## 2016-03-04 LAB — BASIC METABOLIC PANEL
ANION GAP: 7 (ref 5–15)
BUN: 11 mg/dL (ref 6–20)
CO2: 29 mmol/L (ref 22–32)
Calcium: 9 mg/dL (ref 8.9–10.3)
Chloride: 102 mmol/L (ref 101–111)
Creatinine, Ser: 1.04 mg/dL (ref 0.61–1.24)
Glucose, Bld: 94 mg/dL (ref 65–99)
POTASSIUM: 4.6 mmol/L (ref 3.5–5.1)
SODIUM: 138 mmol/L (ref 135–145)

## 2016-03-04 NOTE — Progress Notes (Signed)
Primary Care Physician: Melinda Crutch, MD Referring Physician: [name], PA Cardiologist: Wayne Howell is a 75 y.o. male with a h/o atrial fibrillation, carotid artery disease, Gilbert's disease, gout, hyperlipidemia and hypertension. He had a negative Myoview in 2006. Echocardiogram obtained on 04/18/2014 showed ejection fraction 55-60%, moderately dilated left atrium. He did undergo DCCV on Xarelto on 05/16/2014 with successful conversion to sinus rhythm after 2 shocks (120 J then 200 J). He was last seen by Dr. Percival Spanish on 11/21/2015 at which time he was doing well, exercising regularly, without any recurrence of atrial fibrillation.   He was seen by [name], PA 10/23 after pt called and had complaint of waking up in afib. He was started on Inderal 20 mg tid to help return him to SR. He does run slow in SR, usually high 40's to 50's. He is rate controlled today with v rate at 97 bpm. Since starting BB, he feels ok and is planning to leave for the beach later in week and will return 10/31. He has not had any sustained afib since cardioversion 1/16. Has noticed maybe an hour of irregular heart beat very infrequently. He is not consuming large amounts of caffeine or alcohol. Denies snoring/apnea history. Does not exercise on a regular basis due to back issues.  He returns to the afib clinic after returning from the beach. He does continue to be in rate controlled afib. He is wishing to pursue cardioversion as he is more fatigued in afib. He has not missed any doses of xarelto for at least 3 weeks. Echo updated with normal function and EF.  Today, he denies symptoms of palpitations, chest pain, shortness of breath, orthopnea, PND, lower extremity edema, dizziness, presyncope, syncope, or neurologic sequela. The patient is tolerating medications without difficulties and is otherwise without complaint today.   Past Medical History:  Diagnosis Date  . Atrial fibrillation ([place])   .  C2 cervical fracture ([place])   . Gilbert's disease   . Gout   . Hyperlipidemia   . Hypertension   . Skin cancer    Past Surgical History:  Procedure Laterality Date  . CARDIOVERSION N/A 05/16/2014   Procedure: CARDIOVERSION;  Surgeon: [name], MD;  Location: [facility] ENDOSCOPY;  Service: Cardiovascular;  Laterality: N/A;  . CERVICAL DISC ARTHROPLASTY     x 2  . MOHS SURGERY    . PROSTATE BIOPSY    . TONSILLECTOMY      Current Outpatient Prescriptions  Medication Sig Dispense Refill  . acetaminophen (TYLENOL) 500 MG tablet Take 500-1,000 mg by mouth every 6 (six) hours as needed for moderate pain.    Marland Kitchen colchicine 0.6 MG tablet Take 0.6 mg by mouth as needed. gout  0  . CRESTOR 5 MG tablet Take 5 mg by mouth daily.     Marland Kitchen lisinopril (PRINIVIL,ZESTRIL) 20 MG tablet Take 20 mg by mouth daily.    . tamsulosin (FLOMAX) 0.4 MG CAPS capsule Take 0.4 mg by mouth daily.     Alveda Reasons 20 MG TABS tablet TAKE 1 TABLET(20 MG) BY MOUTH DAILY 30 tablet 5   No current facility-administered medications for this encounter.     Allergies  Allergen Reactions  . Codeine Nausea And Vomiting    Social History   Social History  . Marital status: Married    Spouse name: N/A  . Number of children: 2  . Years of education: 12   Occupational History  .  Retired     Social History Main Topics  . Smoking status: Former Smoker    Types: Cigarettes  . Smokeless tobacco: Never Used     Comment: Quit 33 years ago.  . Alcohol use 1.8 oz/week    3 Standard drinks or equivalent per week  . Drug use: No  . Sexual activity: Not on file   Other Topics Concern  . Not on file   Social History Narrative   Lives at home with wife.  He has 3 step children.    Drinks 2 cups of coffee a day and 2 cups of tea a day     Family History  Problem Relation Age of Onset  . Cancer Mother     Breast  . Heart attack Father 54    Died age 1 of MI    ROS- All systems are reviewed and negative except as per  the HPI above  Physical Exam: Vitals:   03/04/16 1100  BP: 134/78  Pulse: 72  Weight: 221 lb 12.8 oz (100.6 kg)  Height: 5\' 10"  (1.778 m)    GEN- The patient is well appearing, alert and oriented x 3 today.   Head- normocephalic, atraumatic Eyes-  Sclera clear, conjunctiva pink Ears- hearing intact Oropharynx- clear Neck- supple, no JVP Lymph- no cervical lymphadenopathy Lungs- Clear to ausculation bilaterally, normal work of breathing Heart- irregular rate and rhythm, no murmurs, rubs or gallops, PMI not laterally displaced GI- soft, NT, ND, + BS Extremities- no clubbing, cyanosis, or edema MS- no significant deformity or atrophy Skin- no rash or lesion Psych- euthymic mood, full affect Neuro- strength and sensation are intact  EKG- afib at 72 bpm, qrs int 88 ms, qtc 409 ms Epic records reviewed Lab- TSH, free T4 02/17/16- WNL Echo-11/3/ 2017-Study Conclusions  - Left ventricle: The cavity size was normal. Wall thickness was   normal. Systolic function was normal. The estimated ejection   fraction was in the range of 55% to 60%. Wall motion was normal;   there were no regional wall motion abnormalities. - Left atrium: The atrium was mildly dilated. - Pericardium, extracardiac: A trivial pericardial effusion was   identified.  Assessment and Plan: 1. Paroxysmal afib Has been in afib  consistently x 2 weeks Wants to pursue cardioversion Continue with propranolol 20 tid but last dose at 8pm before cardioversion as he is in the upper 40's/low 50's at baseline. Continue xarelto, states NO MISSED DOSES   Will f/u in clinic one week after cardioversion. I think with his low afib burden,it is reasonable to pursue another cardioversion without antiarrythmic.   Wayne Howell, [facility] [address] [place], [place] [address] [phone]

## 2016-03-04 NOTE — Patient Instructions (Signed)
Cardioversion scheduled for Thursday, November 9th  - Arrive at the [facility] and go to admitting at 1130AM  -Do not eat or drink anything after midnight the night prior to your procedure.  - Take all your medication with a sip of water prior to arrival.  - You will not be able to drive home after your procedure.  Your physician has recommended you make the following change in your medication:  1)Take your last dose of propranolol tonight and then stop.

## 2016-03-05 ENCOUNTER — Ambulatory Visit (HOSPITAL_COMMUNITY): Payer: Medicare Other | Admitting: Anesthesiology

## 2016-03-05 ENCOUNTER — Ambulatory Visit (HOSPITAL_COMMUNITY)
Admission: RE | Admit: 2016-03-05 | Discharge: 2016-03-05 | Disposition: A | Discharge status: Home / Self Care | Payer: Medicare Other | Source: Ambulatory Visit | Attending: Cardiology | Admitting: Cardiology

## 2016-03-05 ENCOUNTER — Encounter (HOSPITAL_COMMUNITY): Payer: Self-pay

## 2016-03-05 ENCOUNTER — Encounter (HOSPITAL_COMMUNITY)
Admission: RE | Disposition: A | Discharge status: Home / Self Care | Payer: Self-pay | Source: Ambulatory Visit | Attending: Cardiology

## 2016-03-05 DIAGNOSIS — Z7901 Long term (current) use of anticoagulants: Secondary | ICD-10-CM | POA: Insufficient documentation

## 2016-03-05 DIAGNOSIS — Z79899 Other long term (current) drug therapy: Secondary | ICD-10-CM | POA: Diagnosis not present

## 2016-03-05 DIAGNOSIS — I480 Paroxysmal atrial fibrillation: Secondary | ICD-10-CM | POA: Diagnosis present

## 2016-03-05 DIAGNOSIS — I4891 Unspecified atrial fibrillation: Secondary | ICD-10-CM | POA: Diagnosis not present

## 2016-03-05 DIAGNOSIS — I10 Essential (primary) hypertension: Secondary | ICD-10-CM | POA: Insufficient documentation

## 2016-03-05 DIAGNOSIS — Z87891 Personal history of nicotine dependence: Secondary | ICD-10-CM | POA: Diagnosis not present

## 2016-03-05 DIAGNOSIS — E785 Hyperlipidemia, unspecified: Secondary | ICD-10-CM | POA: Insufficient documentation

## 2016-03-05 DIAGNOSIS — M109 Gout, unspecified: Secondary | ICD-10-CM | POA: Diagnosis not present

## 2016-03-05 HISTORY — PX: CARDIOVERSION: SHX1299

## 2016-03-05 SURGERY — CARDIOVERSION
Anesthesia: General

## 2016-03-05 MED ORDER — PROPOFOL 10 MG/ML IV BOLUS
INTRAVENOUS | Status: DC | PRN
  Administered 2016-03-05: 100 mg via INTRAVENOUS

## 2016-03-05 MED ORDER — LIDOCAINE HCL (CARDIAC) 20 MG/ML IV SOLN
INTRAVENOUS | Status: DC | PRN
  Administered 2016-03-05: 50 mg via INTRATRACHEAL

## 2016-03-05 MED ORDER — SODIUM CHLORIDE 0.9 % IV SOLN
INTRAVENOUS | Status: DC
  Administered 2016-03-05: 13:00:00 via INTRAVENOUS
  Administered 2016-03-05: 500 mL via INTRAVENOUS

## 2016-03-05 NOTE — Interval H&P Note (Signed)
History and Physical Interval Note:  03/05/2016 1:15 PM  Wayne Howell  has presented today for surgery, with the diagnosis of AFIB  The various methods of treatment have been discussed with the patient and family. After consideration of risks, benefits and other options for treatment, the patient has consented to  Procedure(s): CARDIOVERSION (N/A) as a surgical intervention .  The patient's history has been reviewed, patient examined, no change in status, stable for surgery.  I have reviewed the patient's chart and labs.  Questions were answered to the patient's satisfaction.     Rigby Leonhardt [employer]

## 2016-03-05 NOTE — Anesthesia Postprocedure Evaluation (Signed)
Anesthesia Post Note  Patient: Wayne Howell  Procedure(s) Performed: Procedure(s) (LRB): CARDIOVERSION (N/A)  Patient location during evaluation: Endoscopy Anesthesia Type: General Level of consciousness: awake and alert, awake and oriented Pain management: pain level controlled Vital Signs Assessment: post-procedure vital signs reviewed and stable Respiratory status: spontaneous breathing, nonlabored ventilation and respiratory function stable Cardiovascular status: blood pressure returned to baseline Anesthetic complications: no    Last Vitals:  Vitals:   03/05/16 1340 03/05/16 1351  BP: 111/68 (!) 109/59  Pulse: (!) 45 (!) 40  Resp: 15 12  Temp:      Last Pain:  Vitals:   03/05/16 1332  TempSrc: Oral                 Conny Moening COKER

## 2016-03-05 NOTE — H&P (View-Only) (Signed)
Primary Care Physician: Melinda Crutch, MD Referring Physician: [name], PA Cardiologist: Wayne Howell is a 75 y.o. male with a h/o atrial fibrillation, carotid artery disease, Gilbert's disease, gout, hyperlipidemia and hypertension. He had a negative Myoview in 2006. Echocardiogram obtained on 04/18/2014 showed ejection fraction 55-60%, moderately dilated left atrium. He did undergo DCCV on Xarelto on 05/16/2014 with successful conversion to sinus rhythm after 2 shocks (120 J then 200 J). He was last seen by Dr. Percival Spanish on 11/21/2015 at which time he was doing well, exercising regularly, without any recurrence of atrial fibrillation.   He was seen by [name], PA 10/23 after pt called and had complaint of waking up in afib. He was started on Inderal 20 mg tid to help return him to SR. He does run slow in SR, usually high 40's to 50's. He is rate controlled today with v rate at 97 bpm. Since starting BB, he feels ok and is planning to leave for the beach later in week and will return 10/31. He has not had any sustained afib since cardioversion 1/16. Has noticed maybe an hour of irregular heart beat very infrequently. He is not consuming large amounts of caffeine or alcohol. Denies snoring/apnea history. Does not exercise on a regular basis due to back issues.  He returns to the afib clinic after returning from the beach. He does continue to be in rate controlled afib. He is wishing to pursue cardioversion as he is more fatigued in afib. He has not missed any doses of xarelto for at least 3 weeks. Echo updated with normal function and EF.  Today, he denies symptoms of palpitations, chest pain, shortness of breath, orthopnea, PND, lower extremity edema, dizziness, presyncope, syncope, or neurologic sequela. The patient is tolerating medications without difficulties and is otherwise without complaint today.   Past Medical History:  Diagnosis Date  . Atrial fibrillation ([place])   .  C2 cervical fracture ([place])   . Gilbert's disease   . Gout   . Hyperlipidemia   . Hypertension   . Skin cancer    Past Surgical History:  Procedure Laterality Date  . CARDIOVERSION N/A 05/16/2014   Procedure: CARDIOVERSION;  Surgeon: [name], MD;  Location: [facility] ENDOSCOPY;  Service: Cardiovascular;  Laterality: N/A;  . CERVICAL DISC ARTHROPLASTY     x 2  . MOHS SURGERY    . PROSTATE BIOPSY    . TONSILLECTOMY      Current Outpatient Prescriptions  Medication Sig Dispense Refill  . acetaminophen (TYLENOL) 500 MG tablet Take 500-1,000 mg by mouth every 6 (six) hours as needed for moderate pain.    Marland Kitchen colchicine 0.6 MG tablet Take 0.6 mg by mouth as needed. gout  0  . CRESTOR 5 MG tablet Take 5 mg by mouth daily.     Marland Kitchen lisinopril (PRINIVIL,ZESTRIL) 20 MG tablet Take 20 mg by mouth daily.    . tamsulosin (FLOMAX) 0.4 MG CAPS capsule Take 0.4 mg by mouth daily.     Alveda Reasons 20 MG TABS tablet TAKE 1 TABLET(20 MG) BY MOUTH DAILY 30 tablet 5   No current facility-administered medications for this encounter.     Allergies  Allergen Reactions  . Codeine Nausea And Vomiting    Social History   Social History  . Marital status: Married    Spouse name: N/A  . Number of children: 2  . Years of education: 12   Occupational History  .  Retired     Social History Main Topics  . Smoking status: Former Smoker    Types: Cigarettes  . Smokeless tobacco: Never Used     Comment: Quit 33 years ago.  . Alcohol use 1.8 oz/week    3 Standard drinks or equivalent per week  . Drug use: No  . Sexual activity: Not on file   Other Topics Concern  . Not on file   Social History Narrative   Lives at home with wife.  He has 3 step children.    Drinks 2 cups of coffee a day and 2 cups of tea a day     Family History  Problem Relation Age of Onset  . Cancer Mother     Breast  . Heart attack Father 54    Died age 41 of MI    ROS- All systems are reviewed and negative except as per  the HPI above  Physical Exam: Vitals:   03/04/16 1100  BP: 134/78  Pulse: 72  Weight: 221 lb 12.8 oz (100.6 kg)  Height: 5\' 10"  (1.778 m)    GEN- The patient is well appearing, alert and oriented x 3 today.   Head- normocephalic, atraumatic Eyes-  Sclera clear, conjunctiva pink Ears- hearing intact Oropharynx- clear Neck- supple, no JVP Lymph- no cervical lymphadenopathy Lungs- Clear to ausculation bilaterally, normal work of breathing Heart- irregular rate and rhythm, no murmurs, rubs or gallops, PMI not laterally displaced GI- soft, NT, ND, + BS Extremities- no clubbing, cyanosis, or edema MS- no significant deformity or atrophy Skin- no rash or lesion Psych- euthymic mood, full affect Neuro- strength and sensation are intact  EKG- afib at 72 bpm, qrs int 88 ms, qtc 409 ms Epic records reviewed Lab- TSH, free T4 02/17/16- WNL Echo-11/3/ 2017-Study Conclusions  - Left ventricle: The cavity size was normal. Wall thickness was   normal. Systolic function was normal. The estimated ejection   fraction was in the range of 55% to 60%. Wall motion was normal;   there were no regional wall motion abnormalities. - Left atrium: The atrium was mildly dilated. - Pericardium, extracardiac: A trivial pericardial effusion was   identified.  Assessment and Plan: 1. Paroxysmal afib Has been in afib  consistently x 2 weeks Wants to pursue cardioversion Continue with propranolol 20 tid but last dose at 8pm before cardioversion as he is in the upper 40's/low 50's at baseline. Continue xarelto, states NO MISSED DOSES   Will f/u in clinic one week after cardioversion. I think with his low afib burden,it is reasonable to pursue another cardioversion without antiarrythmic.   Wayne Howell, [facility] [address] [place], [place] [address] [phone]

## 2016-03-05 NOTE — Procedures (Signed)
Electrical Cardioversion Procedure Note ROARKE DUTKIEWICZ [id number] May 26, 1940  Procedure: Electrical Cardioversion Indications:  Atrial Fibrillation.  He has not missed any Xarelto doses.  Procedure Details Consent: Risks of procedure as well as the alternatives and risks of each were explained to the (patient/caregiver).  Consent for procedure obtained. Time Out: Verified patient identification, verified procedure, site/side was marked, verified correct patient position, special equipment/implants available, medications/allergies/relevent history reviewed, required imaging and test results available.  Performed  Patient placed on cardiac monitor, pulse oximetry, supplemental oxygen as necessary.  Sedation given: Propofol per anesthesiology Pacer pads placed anterior and posterior chest.  Cardioverted 1 time(s).  Cardioverted at [place].  Evaluation Findings: Post procedure EKG shows: NSR Complications: None Patient did tolerate procedure well.   Loralie Champagne 03/05/2016, 1:23 PM

## 2016-03-05 NOTE — Anesthesia Preprocedure Evaluation (Addendum)
Anesthesia Evaluation  Patient identified by MRN, date of birth, ID band Patient awake    Reviewed: Allergy & Precautions, NPO status , Patient's Chart, lab work & pertinent test results  Airway Mallampati: I  TM Distance: >3 FB Neck ROM: full    Dental  (+) Teeth Intact, Dental Advidsory Given   Pulmonary former smoker,    breath sounds clear to auscultation       Cardiovascular hypertension,  Rhythm:irregular Rate:Normal     Neuro/Psych    GI/Hepatic   Endo/Other    Renal/GU      Musculoskeletal   Abdominal   Peds  Hematology   Anesthesia Other Findings Left ventricle: The cavity size was normal. Wall thickness was   normal. Systolic function was normal. The estimated ejection   fraction was in the range of 55% to 60%. Wall motion was normal;   there were no regional wall motion abnormalities. - Left atrium: The atrium was mildly dilated. - Pericardium, extracardiac: A trivial pericardial effusion was   identified.  Reproductive/Obstetrics                           Anesthesia Physical Anesthesia Plan  ASA: III  Anesthesia Plan: General   Post-op Pain Management:    Induction:   Airway Management Planned:   Additional Equipment:   Intra-op Plan:   Post-operative Plan:   Informed Consent: I have reviewed the patients History and Physical, chart, labs and discussed the procedure including the risks, benefits and alternatives for the proposed anesthesia with the patient or authorized representative who has indicated his/her understanding and acceptance.   Dental Advisory Given  Plan Discussed with:   Anesthesia Plan Comments:        Anesthesia Quick Evaluation

## 2016-03-05 NOTE — Anesthesia Procedure Notes (Signed)
Date/Time: 03/05/2016 12:57 PM Performed by: Neldon Newport Pre-anesthesia Checklist: Timeout performed, Patient being monitored, Suction available, Emergency Drugs available and Patient identified Patient Re-evaluated:Patient Re-evaluated prior to inductionOxygen Delivery Method: Ambu bag Preoxygenation: Pre-oxygenation with 100% oxygen Intubation Type: IV induction

## 2016-03-05 NOTE — Transfer of Care (Signed)
Immediate Anesthesia Transfer of Care Note  Patient: Wayne Howell  Procedure(s) Performed: Procedure(s): CARDIOVERSION (N/A)  Patient Location: Endoscopy Unit  Anesthesia Type:General  Level of Consciousness: awake, alert  and oriented  Airway & Oxygen Therapy: Patient Spontanous Breathing and Patient connected to nasal cannula oxygen  Post-op Assessment: Report given to RN, Post -op Vital signs reviewed and stable, Patient moving all extremities X 4 and Patient able to stick tongue midline  Post vital signs: Reviewed and stable  Last Vitals:  Vitals:   03/05/16 1230 03/05/16 1235  BP: 118/73   Resp: 16   Temp:  36.7 C    Last Pain:  Vitals:   03/05/16 1235  TempSrc: Oral         Complications: No apparent anesthesia complications

## 2016-03-12 ENCOUNTER — Encounter (HOSPITAL_COMMUNITY): Payer: Self-pay | Admitting: Nurse Practitioner

## 2016-03-12 ENCOUNTER — Ambulatory Visit (HOSPITAL_COMMUNITY)
Admission: RE | Admit: 2016-03-12 | Discharge: 2016-03-12 | Disposition: A | Discharge status: Home / Self Care | Payer: Medicare Other | Source: Ambulatory Visit | Attending: Nurse Practitioner | Admitting: Nurse Practitioner

## 2016-03-12 VITALS — BP 140/74 | HR 46 | Ht 70.0 in | Wt 222.6 lb

## 2016-03-12 DIAGNOSIS — I4891 Unspecified atrial fibrillation: Secondary | ICD-10-CM | POA: Diagnosis present

## 2016-03-12 DIAGNOSIS — Z7901 Long term (current) use of anticoagulants: Secondary | ICD-10-CM | POA: Diagnosis not present

## 2016-03-12 DIAGNOSIS — I481 Persistent atrial fibrillation: Secondary | ICD-10-CM | POA: Insufficient documentation

## 2016-03-12 DIAGNOSIS — Z79899 Other long term (current) drug therapy: Secondary | ICD-10-CM | POA: Diagnosis not present

## 2016-03-12 DIAGNOSIS — M109 Gout, unspecified: Secondary | ICD-10-CM | POA: Diagnosis not present

## 2016-03-12 DIAGNOSIS — I10 Essential (primary) hypertension: Secondary | ICD-10-CM | POA: Insufficient documentation

## 2016-03-12 DIAGNOSIS — R001 Bradycardia, unspecified: Secondary | ICD-10-CM | POA: Insufficient documentation

## 2016-03-12 DIAGNOSIS — I440 Atrioventricular block, first degree: Secondary | ICD-10-CM | POA: Insufficient documentation

## 2016-03-12 DIAGNOSIS — E785 Hyperlipidemia, unspecified: Secondary | ICD-10-CM | POA: Insufficient documentation

## 2016-03-12 DIAGNOSIS — I4819 Other persistent atrial fibrillation: Secondary | ICD-10-CM

## 2016-03-12 DIAGNOSIS — Z87891 Personal history of nicotine dependence: Secondary | ICD-10-CM | POA: Insufficient documentation

## 2016-03-12 NOTE — Progress Notes (Signed)
Primary Care Physician: Melinda Crutch, MD Referring Physician: [name], PA Cardiologist: Wayne Howell is a 75 y.o. male with a h/o atrial fibrillation, carotid artery disease, Gilbert's disease, gout, hyperlipidemia and hypertension. He had a negative Myoview in 2006. Echocardiogram obtained on 04/18/2014 showed ejection fraction 55-60%, moderately dilated left atrium. He did undergo DCCV on Xarelto on 05/16/2014 with successful conversion to sinus rhythm after 2 shocks (120 J then 200 J). He was last seen by Dr. Percival Spanish on 11/21/2015 at which time he was doing well, exercising regularly, without any recurrence of atrial fibrillation.   He was seen by Wayne Deforest, PA 10/23 after pt called and had complaint of waking up in afib. He was started on Inderal 20 mg tid to help return him to SR. He does run slow in SR, usually high 40's to 50's. He is rate controlled today with v rate at 97 bpm. Since starting BB, he feels ok and is planning to leave for the beach later in week and will return 10/31. He has not had any sustained afib since cardioversion 1/16. Has noticed maybe an hour of irregular heart beat very infrequently. He is not consuming large amounts of caffeine or alcohol. Denies snoring/apnea history. Does not exercise on a regular basis due to back issues.  He returns to the afib clinic after returning from the beach. He does continue to be in rate controlled afib. He is wishing to pursue cardioversion as he is more fatigued in afib. He has not missed any doses of xarelto for at least 3 weeks. Echo updated with normal function and EF.  Today, he denies symptoms of palpitations, chest pain, shortness of breath, orthopnea, PND, lower extremity edema, dizziness, presyncope, syncope, or neurologic sequela. The patient is tolerating medications without difficulties and is otherwise without complaint today.   Past Medical History:  Diagnosis Date  . Atrial fibrillation ([place])   .  C2 cervical fracture ([place])   . Gilbert's disease   . Gout   . Hyperlipidemia   . Hypertension   . Skin cancer    Past Surgical History:  Procedure Laterality Date  . CARDIOVERSION N/A 05/16/2014   Procedure: CARDIOVERSION;  Surgeon: [name], MD;  Location: [facility] ENDOSCOPY;  Service: Cardiovascular;  Laterality: N/A;  . CARDIOVERSION N/A 03/05/2016   Procedure: CARDIOVERSION;  Surgeon: [name], MD;  Location: [place];  Service: Cardiovascular;  Laterality: N/A;  . CERVICAL DISC ARTHROPLASTY     x 2  . MOHS SURGERY    . PROSTATE BIOPSY    . TONSILLECTOMY      Current Outpatient Prescriptions  Medication Sig Dispense Refill  . acetaminophen (TYLENOL) 500 MG tablet Take 500-1,000 mg by mouth every 6 (six) hours as needed for moderate pain.    Marland Kitchen colchicine 0.6 MG tablet Take 0.6 mg by mouth as needed. gout  0  . CRESTOR 5 MG tablet Take 5 mg by mouth daily.     Marland Kitchen lisinopril (PRINIVIL,ZESTRIL) 20 MG tablet Take 20 mg by mouth daily.    . tamsulosin (FLOMAX) 0.4 MG CAPS capsule Take 0.4 mg by mouth daily.     Alveda Reasons 20 MG TABS tablet TAKE 1 TABLET(20 MG) BY MOUTH DAILY 30 tablet 5   No current facility-administered medications for this encounter.     Allergies  Allergen Reactions  . Codeine Nausea And Vomiting    Social History   Social History  . Marital status:  Married    Spouse name: N/A  . Number of children: 2  . Years of education: 12   Occupational History  . Retired     Social History Main Topics  . Smoking status: Former Smoker    Types: Cigarettes  . Smokeless tobacco: Never Used     Comment: Quit 33 years ago.  . Alcohol use 1.8 oz/week    3 Standard drinks or equivalent per week  . Drug use: No  . Sexual activity: Not on file   Other Topics Concern  . Not on file   Social History Narrative   Lives at home with wife.  He has 3 step children.    Drinks 2 cups of coffee a day and 2 cups of tea a day     Family History  Problem  Relation Age of Onset  . Cancer Mother     Breast  . Heart attack Father 24    Died age 50 of MI    ROS- All systems are reviewed and negative except as per the HPI above  Physical Exam: Vitals:   03/12/16 1120  BP: 140/74  Pulse: (!) 46  Weight: 222 lb 9.6 oz (101 kg)  Height: 5\' 10"  (1.778 m)    GEN- The patient is well appearing, alert and oriented x 3 today.   Head- normocephalic, atraumatic Eyes-  Sclera clear, conjunctiva pink Ears- hearing intact Oropharynx- clear Neck- supple, no JVP Lymph- no cervical lymphadenopathy Lungs- Clear to ausculation bilaterally, normal work of breathing Heart- irregular rate and rhythm, no murmurs, rubs or gallops, PMI not laterally displaced GI- soft, NT, ND, + BS Extremities- no clubbing, cyanosis, or edema MS- no significant deformity or atrophy Skin- no rash or lesion Psych- euthymic mood, full affect Neuro- strength and sensation are intact  EKG- S brady at 46 bpm, pr int 226 ms, qrs int 86 ms, qtc 385 ms Epic records reviewed Lab- TSH, free T4 02/17/16- WNL Echo-11/3/ 2017-Study Conclusions  - Left ventricle: The cavity size was normal. Wall thickness was   normal. Systolic function was normal. The estimated ejection   fraction was in the range of 55% to 60%. Wall motion was normal;   there were no regional wall motion abnormalities. - Left atrium: The atrium was mildly dilated. - Pericardium, extracardiac: A trivial pericardial effusion was   identified.  Assessment and Plan: 1. Persistent symtomatic afib S/p successful cardioversion Bradycardia at baseline, chronic, not symptomatic Discussed if return of afib with known brady may complicate choice of antiarrythmic  that would not worsen brady May be a candidate to go to ablation without failing AAD due to baseline bradycardia If return of afib, will schedule consult with Dr. Rayann Heman Off inderal Continue xarelto 20 mg daily without missed doses    Wayne Penny C.  Michon Howell, [facility] [address] [place], [place] [address] [phone]

## 2016-04-08 ENCOUNTER — Other Ambulatory Visit: Payer: Self-pay | Admitting: Cardiology

## 2016-04-29 DIAGNOSIS — L570 Actinic keratosis: Secondary | ICD-10-CM | POA: Diagnosis not present

## 2016-04-29 DIAGNOSIS — D485 Neoplasm of uncertain behavior of skin: Secondary | ICD-10-CM | POA: Diagnosis not present

## 2016-04-29 DIAGNOSIS — C44519 Basal cell carcinoma of skin of other part of trunk: Secondary | ICD-10-CM | POA: Diagnosis not present

## 2016-04-29 DIAGNOSIS — Z85828 Personal history of other malignant neoplasm of skin: Secondary | ICD-10-CM | POA: Diagnosis not present

## 2016-04-29 DIAGNOSIS — L814 Other melanin hyperpigmentation: Secondary | ICD-10-CM | POA: Diagnosis not present

## 2016-04-29 DIAGNOSIS — L905 Scar conditions and fibrosis of skin: Secondary | ICD-10-CM | POA: Diagnosis not present

## 2016-04-29 NOTE — Progress Notes (Signed)
HPI The patient presents for evaluation of  atrial fibrillation. He is status post cardioversion again in Nov of last year.  Since then he had one further episode of palpitations that he thought was atrial fib and that lasted for several minutes.  Otherwise he has done well.  He had had wine 24 hours prior to this episode but otherwise could identify no trigger.  He has otherwise been doing well.  The patient denies any new symptoms such as chest discomfort, neck or arm discomfort. There has been no new shortness of breath, PND or orthopnea. There have been no reported palpitations, presyncope or syncope.  Allergies  Allergen Reactions  . Codeine Nausea And Vomiting    Current Outpatient Prescriptions  Medication Sig Dispense Refill  . acetaminophen (TYLENOL) 500 MG tablet Take 500-1,000 mg by mouth every 6 (six) hours as needed for moderate pain.    Marland Kitchen colchicine 0.6 MG tablet Take 0.6 mg by mouth as needed. gout  0  . CRESTOR 5 MG tablet Take 5 mg by mouth daily.     Marland Kitchen lisinopril (PRINIVIL,ZESTRIL) 20 MG tablet Take 20 mg by mouth daily.    . tamsulosin (FLOMAX) 0.4 MG CAPS capsule Take 0.4 mg by mouth daily.     Alveda Reasons 20 MG TABS tablet TAKE 1 TABLET(20 MG) BY MOUTH DAILY 90 tablet 1   No current facility-administered medications for this visit.     Past Medical History:  Diagnosis Date  . Atrial fibrillation (Wayne Howell)   . C2 cervical fracture (Wayne Howell)   . Gilbert's disease   . Gout   . Hyperlipidemia   . Hypertension   . Skin cancer     Past Surgical History:  Procedure Laterality Date  . CARDIOVERSION N/A 05/16/2014   Procedure: CARDIOVERSION;  Surgeon: Wayne Hector, MD;  Location: [facility] ENDOSCOPY;  Service: Cardiovascular;  Laterality: N/A;  . CARDIOVERSION N/A 03/05/2016   Procedure: CARDIOVERSION;  Surgeon: Wayne Dresser, MD;  Location: [place];  Service: Cardiovascular;  Laterality: N/A;  . CERVICAL DISC ARTHROPLASTY     x 2  . MOHS SURGERY    . PROSTATE BIOPSY      . TONSILLECTOMY      ROS:   Otherwise as stated in the HPI and negative for all other systems.    PHYSICAL EXAM BP 120/78 (BP Location: Left Arm, Patient Position: Sitting, Cuff Size: Large)   Pulse (!) 50   Ht 5\' 10"  (1.778 m)   Wt 221 lb (100.2 kg)   BMI 31.71 kg/m   GENERAL:  Well appearing NECK:  No jugular venous distention, waveform within normal limits, carotid upstroke brisk and symmetric, no bruits, no thyromegaly LUNGS:  Clear to auscultation bilaterally BACK:  No CVA tenderness CHEST:  Unremarkable HEART:  PMI not displaced or sustained,S1 and S2 within normal limits, no S3, no clicks, no rubs, no murmurs, irregular ABD:  Flat, positive bowel sounds normal in frequency in pitch, no bruits, no rebound, no guarding, no midline pulsatile mass, no hepatomegaly, no splenomegaly EXT:  2 plus pulses throughout, mild edema, no cyanosis no clubbing    ASSESSMENT AND PLAN  ATRIAL FIB:  [name] has a CHA2DS2 - VASc score of 3 with a risk of stroke of 3.2%. He tolerates anticoagulation. No change in therapy is indicated.  We did discuss antiarrhythmic therapy. However, he doesn't think he's having his symptoms frequently enough to try this. He'll let me know if his symptoms increase in  frequency or intensity.  HTN:  The blood pressure is at target. No change in medications is indicated. We will continue with therapeutic lifestyle changes (TLC).

## 2016-04-30 ENCOUNTER — Encounter: Payer: Self-pay | Admitting: Cardiology

## 2016-04-30 ENCOUNTER — Ambulatory Visit (INDEPENDENT_AMBULATORY_CARE_PROVIDER_SITE_OTHER): Payer: Medicare Other | Admitting: Cardiology

## 2016-04-30 VITALS — BP 120/78 | HR 50 | Ht 70.0 in | Wt 221.0 lb

## 2016-04-30 DIAGNOSIS — I481 Persistent atrial fibrillation: Secondary | ICD-10-CM | POA: Diagnosis not present

## 2016-04-30 DIAGNOSIS — I4819 Other persistent atrial fibrillation: Secondary | ICD-10-CM

## 2016-04-30 NOTE — Patient Instructions (Signed)

## 2016-06-03 DIAGNOSIS — C44519 Basal cell carcinoma of skin of other part of trunk: Secondary | ICD-10-CM | POA: Diagnosis not present

## 2016-07-08 DIAGNOSIS — R3915 Urgency of urination: Secondary | ICD-10-CM | POA: Diagnosis not present

## 2016-07-08 DIAGNOSIS — N401 Enlarged prostate with lower urinary tract symptoms: Secondary | ICD-10-CM | POA: Diagnosis not present

## 2016-07-08 DIAGNOSIS — R351 Nocturia: Secondary | ICD-10-CM | POA: Diagnosis not present

## 2016-07-08 DIAGNOSIS — R3912 Poor urinary stream: Secondary | ICD-10-CM | POA: Diagnosis not present

## 2016-07-15 DIAGNOSIS — L905 Scar conditions and fibrosis of skin: Secondary | ICD-10-CM | POA: Diagnosis not present

## 2016-07-15 DIAGNOSIS — L570 Actinic keratosis: Secondary | ICD-10-CM | POA: Diagnosis not present

## 2016-07-15 DIAGNOSIS — Z85828 Personal history of other malignant neoplasm of skin: Secondary | ICD-10-CM | POA: Diagnosis not present

## 2016-07-28 DIAGNOSIS — M545 Low back pain: Secondary | ICD-10-CM | POA: Diagnosis not present

## 2016-07-28 DIAGNOSIS — Z0000 Encounter for general adult medical examination without abnormal findings: Secondary | ICD-10-CM | POA: Diagnosis not present

## 2016-07-28 DIAGNOSIS — E7800 Pure hypercholesterolemia, unspecified: Secondary | ICD-10-CM | POA: Diagnosis not present

## 2016-07-28 DIAGNOSIS — I10 Essential (primary) hypertension: Secondary | ICD-10-CM | POA: Diagnosis not present

## 2016-07-28 DIAGNOSIS — Z1159 Encounter for screening for other viral diseases: Secondary | ICD-10-CM | POA: Diagnosis not present

## 2016-08-21 ENCOUNTER — Telehealth: Payer: Self-pay | Admitting: Cardiology

## 2016-08-21 DIAGNOSIS — R0789 Other chest pain: Secondary | ICD-10-CM | POA: Diagnosis not present

## 2016-08-21 DIAGNOSIS — I4891 Unspecified atrial fibrillation: Secondary | ICD-10-CM | POA: Diagnosis not present

## 2016-08-21 DIAGNOSIS — Z7901 Long term (current) use of anticoagulants: Secondary | ICD-10-CM | POA: Diagnosis not present

## 2016-08-21 DIAGNOSIS — I10 Essential (primary) hypertension: Secondary | ICD-10-CM | POA: Diagnosis not present

## 2016-08-21 DIAGNOSIS — R0602 Shortness of breath: Secondary | ICD-10-CM | POA: Diagnosis not present

## 2016-08-21 DIAGNOSIS — E785 Hyperlipidemia, unspecified: Secondary | ICD-10-CM | POA: Diagnosis not present

## 2016-08-21 DIAGNOSIS — Z87891 Personal history of nicotine dependence: Secondary | ICD-10-CM | POA: Diagnosis not present

## 2016-08-21 DIAGNOSIS — Z79899 Other long term (current) drug therapy: Secondary | ICD-10-CM | POA: Diagnosis not present

## 2016-08-21 NOTE — Telephone Encounter (Signed)
Patient called back to inform that he did go to the ED at [facility] and he was in Atrial Fibrillation again with heart rate at 88. The ED wanted him to stay over night but he refused. He did state that he felt "better than he has in a while." He would like an appointment earlier than his 5/9 appointment with Dr. Percival Spanish but there is nothing available with him nor the APP's. He was instructed to go to the hospital if he was symptomatic or felt worse. Will route to the physician for further recommendations.

## 2016-08-21 NOTE — Telephone Encounter (Signed)
Left a message to return the phone call, per [facility]

## 2016-08-21 NOTE — Telephone Encounter (Signed)
Spoke with the patient. He stated that for 7-8 days he has felt like he is going in and out of atrial fibrillation. He stated that his heart rate was in the 60-70's but normally is in the 50's. He has not checked his blood pressure. He has experienced chest discomfort that lasts a few minutes and shortness of breath. He stated that he is in [place] and would be leaving today. Upon discussing with the DOD it was advised to go to the hospital or urgent care before he left the beach. He verbalized his understanding and stated that he would go.

## 2016-08-21 NOTE — Telephone Encounter (Signed)
New message    Pt is calling stating he thinks he is Afib. He said he thinks it's coming and going. He will feel fine one day and then feel bad the next. He is concerned.

## 2016-08-23 NOTE — Telephone Encounter (Signed)
Add to my schedule on Thursday.

## 2016-08-24 ENCOUNTER — Telehealth: Payer: Self-pay | Admitting: Cardiology

## 2016-08-24 NOTE — Telephone Encounter (Signed)
Faxed request to [place] to obtain EKG/Records from ER visit this past weekend for appointment on 08/27/16 with Dr Percival Spanish.  Faxed to [place] [phone]. lp

## 2016-08-24 NOTE — Telephone Encounter (Signed)
Pt schedule to see Dr Percival Spanish Thursday May 3rd @ 11:40

## 2016-08-24 NOTE — Telephone Encounter (Signed)
Message routed to [facility] records to request EKG in anticipation of 08/27/16 appt

## 2016-08-24 NOTE — Telephone Encounter (Signed)
New message      Pt went to [employer] over the weekend and ended up in the ER.  He is in afib.  The ER did an EKG.  Pt has an appt with Dr Percival Spanish this week.   If you want the EKG, please fax on our letterhead a request for the EKG including pt's name, dob and date of service (08-21-16) to [phone] attn [facility] ER.

## 2016-08-25 NOTE — Telephone Encounter (Signed)
Faxed form to get records to [facility] medical records

## 2016-08-26 NOTE — Progress Notes (Signed)
HPI The patient presents for evaluation of  atrial fibrillation. He is status post cardioversion again in Nov of last year.  He called because he was in atrial fib and was in the ED at [facility] recently and was in atrial fib with a rate in the 80s.  He said that he did feel palpitations at that time off and on but was not particularly symptomatic. He didn't have any presyncope or syncope. He has no chest pressure, neck or arm discomfort. He wasn't particularly short of breath and had no PND or orthopnea. He went to the emergency room only at the insistence of our office he was in fibrillation with rate in 80s. I have reviewed records. Chest x-ray was unremarkable. His blood pressure was well controlled. Oxygen saturation 100%. Labs were unremarkable except for a mildly reduced platelet count. He was not anemic.  Allergies  Allergen Reactions  . Codeine Nausea And Vomiting    Current Outpatient Prescriptions  Medication Sig Dispense Refill  . acetaminophen (TYLENOL) 500 MG tablet Take 500-1,000 mg by mouth every 6 (six) hours as needed for moderate pain.    Marland Kitchen colchicine 0.6 MG tablet Take 0.6 mg by mouth as needed. gout  0  . CRESTOR 5 MG tablet Take 5 mg by mouth daily.     Marland Kitchen lisinopril (PRINIVIL,ZESTRIL) 20 MG tablet Take 20 mg by mouth daily.    . tamsulosin (FLOMAX) 0.4 MG CAPS capsule Take 0.4 mg by mouth daily.     Alveda Reasons 20 MG TABS tablet TAKE 1 TABLET(20 MG) BY MOUTH DAILY 90 tablet 1   No current facility-administered medications for this visit.     Past Medical History:  Diagnosis Date  . Atrial fibrillation ([place])   . C2 cervical fracture ([place])   . Gilbert's disease   . Gout   . Hyperlipidemia   . Hypertension   . Skin cancer     Past Surgical History:  Procedure Laterality Date  . CARDIOVERSION N/A 05/16/2014   Procedure: CARDIOVERSION;  Surgeon: [name], MD;  Location: [facility] ENDOSCOPY;  Service: Cardiovascular;  Laterality: N/A;  . CARDIOVERSION N/A  03/05/2016   Procedure: CARDIOVERSION;  Surgeon: [name], MD;  Location: [place];  Service: Cardiovascular;  Laterality: N/A;  . CERVICAL DISC ARTHROPLASTY     x 2  . MOHS SURGERY    . PROSTATE BIOPSY    . TONSILLECTOMY      ROS:    Otherwise as stated in the HPI and negative for all other systems.    PHYSICAL EXAM BP 122/80   Pulse 80   Ht 5\' 10"  (1.778 m)   Wt 219 lb 6.4 oz (99.5 kg)   BMI 31.48 kg/m   GENERAL:  Well appearing NECK:  No jugular venous distention, waveform within normal limits, carotid upstroke brisk and symmetric, no bruits, no thyromegaly LUNGS:  Clear to auscultation bilaterally BACK:  No CVA tenderness CHEST:  Unremarkable HEART:  PMI not displaced or sustained,S1 and S2 within normal limits, no S3, no clicks, no rubs, no murmurs, irregular ABD:  Flat, positive bowel sounds normal in frequency in pitch, no bruits, no rebound, no guarding, no midline pulsatile mass, no hepatomegaly, no splenomegaly EXT:  2 plus pulses throughout, mild edema, no cyanosis no clubbing  EKG:  Atrial fibrillation, rate 80, axis within normal limits, intervals within normal limits, no acute ST-T wave changes. Poor anterior R wave progression. 08/27/2016  ASSESSMENT AND PLAN  ATRIAL FIB:  Mr. AVELINO HERREN has a CHA2DS2 - VASc score of 3 with a risk of stroke of 3.2%. He tolerates anticoagulation.   We'll long discussion about this. He does not feel his fibrillation. He's now been cardioverted twice with recurrent fibrillation. Given this no change in therapy is suggested and he will just pursue rate control and anticoagulation. I'm going to place a 24-hour Holter. No continue on his meds as listed.  HTN:     The blood pressure is at target. No change in medications is indicated. We will continue with therapeutic lifestyle changes (TLC).   (Greater than 25 minutes reviewing all data with greater than 50% face to face with the patient).

## 2016-08-27 ENCOUNTER — Ambulatory Visit (INDEPENDENT_AMBULATORY_CARE_PROVIDER_SITE_OTHER): Payer: Medicare Other | Admitting: Cardiology

## 2016-08-27 ENCOUNTER — Encounter: Payer: Self-pay | Admitting: Cardiology

## 2016-08-27 VITALS — BP 122/80 | HR 80 | Ht 70.0 in | Wt 219.4 lb

## 2016-08-27 DIAGNOSIS — I482 Chronic atrial fibrillation: Secondary | ICD-10-CM

## 2016-08-27 DIAGNOSIS — I10 Essential (primary) hypertension: Secondary | ICD-10-CM | POA: Diagnosis not present

## 2016-08-27 DIAGNOSIS — I4821 Permanent atrial fibrillation: Secondary | ICD-10-CM

## 2016-08-27 NOTE — Patient Instructions (Signed)
Medication Instructions:  Continue current medications  Labwork: None Ordered  Testing/Procedures: Your physician has recommended that you wear a 24 hour holter monitor. Holter monitors are medical devices that record the heart's electrical activity. Doctors most often use these monitors to diagnose arrhythmias. Arrhythmias are problems with the speed or rhythm of the heartbeat. The monitor is a small, portable device. You can wear one while you do your normal daily activities. This is usually used to diagnose what is causing palpitations/syncope (passing out).   Follow-Up: Your physician recommends that you schedule a follow-up appointment in: 2 Months.   Any Other Special Instructions Will Be Listed Below (If Applicable).   If you need a refill on your cardiac medications before your next appointment, please call your pharmacy.

## 2016-08-27 NOTE — Telephone Encounter (Signed)
Record received from [facility], pt had appt with Dr Percival Spanish today

## 2016-09-02 ENCOUNTER — Ambulatory Visit: Payer: Medicare Other | Admitting: Cardiology

## 2016-09-08 ENCOUNTER — Ambulatory Visit (INDEPENDENT_AMBULATORY_CARE_PROVIDER_SITE_OTHER): Payer: Medicare Other

## 2016-09-08 DIAGNOSIS — I4821 Permanent atrial fibrillation: Secondary | ICD-10-CM

## 2016-09-08 DIAGNOSIS — I482 Chronic atrial fibrillation: Secondary | ICD-10-CM

## 2016-09-16 ENCOUNTER — Telehealth: Payer: Self-pay | Admitting: Cardiology

## 2016-09-16 NOTE — Telephone Encounter (Signed)
Notes recorded by Minus Breeding, MD on 09/15/2016 at 9:07 AM EDT Atrial fib on monitor with controlled ventricular rate. Some slow ventricular rate during sleeping hours. No change in therapy. Call Mr. Stuard with the results and send results to [name], MD   Patient viewed "study highlights" section in MyChart results. He was concerned about NSVT and had looked up that this can cause cardiac arrest. Explained to patient NSVT and reviewed actual holter report section of ventricular ectopy w/patient. All of patient's questions were answered to his satisfaction.

## 2016-09-16 NOTE — Telephone Encounter (Signed)
New message      Pt got his monitor results off of mychart.  He has a question regarding the results and his AFIB.  Please call

## 2016-10-06 ENCOUNTER — Other Ambulatory Visit: Payer: Self-pay

## 2016-10-06 MED ORDER — RIVAROXABAN 20 MG PO TABS: ORAL_TABLET | ORAL | 3 refills | Status: DC

## 2016-10-15 ENCOUNTER — Encounter: Payer: Self-pay | Admitting: Cardiology

## 2016-10-29 ENCOUNTER — Ambulatory Visit: Payer: Medicare Other | Admitting: Cardiology

## 2016-11-02 NOTE — Progress Notes (Deleted)
HPI The patient presents for evaluation of  atrial fibrillation. He is status post cardioversion again in Nov of last year.  He was in atrial fib and was in the ED at [facility] recently with a rate in the 80s.  I saw him after that.  Since he does not feel this we decided to pursue rate control and anticoagulation.  ***    He said that he did feel palpitations at that time off and on but was not particularly symptomatic. He didn't have any presyncope or syncope. He has no chest pressure, neck or arm discomfort. He wasn't particularly short of breath and had no PND or orthopnea. He went to the emergency room only at the insistence of our office he was in fibrillation with rate in 80s. I have reviewed records. Chest x-ray was unremarkable. His blood pressure was well controlled. Oxygen saturation 100%. Labs were unremarkable except for a mildly reduced platelet count. He was not anemic.  Allergies  Allergen Reactions  . Codeine Nausea And Vomiting    Current Outpatient Prescriptions  Medication Sig Dispense Refill  . acetaminophen (TYLENOL) 500 MG tablet Take 500-1,000 mg by mouth every 6 (six) hours as needed for moderate pain.    Marland Kitchen colchicine 0.6 MG tablet Take 0.6 mg by mouth as needed. gout  0  . CRESTOR 5 MG tablet Take 5 mg by mouth daily.     Marland Kitchen lisinopril (PRINIVIL,ZESTRIL) 20 MG tablet Take 20 mg by mouth daily.    . rivaroxaban (XARELTO) 20 MG TABS tablet TAKE 1 TABLET(20 MG) BY MOUTH DAILY 90 tablet 3  . tamsulosin (FLOMAX) 0.4 MG CAPS capsule Take 0.4 mg by mouth daily.      No current facility-administered medications for this visit.     Past Medical History:  Diagnosis Date  . Atrial fibrillation ([place])   . C2 cervical fracture ([place])   . Gilbert's disease   . Gout   . Hyperlipidemia   . Hypertension   . Skin cancer     Past Surgical History:  Procedure Laterality Date  . CARDIOVERSION N/A 05/16/2014   Procedure: CARDIOVERSION;  Surgeon: [name], MD;   Location: [facility] ENDOSCOPY;  Service: Cardiovascular;  Laterality: N/A;  . CARDIOVERSION N/A 03/05/2016   Procedure: CARDIOVERSION;  Surgeon: [name], MD;  Location: [place];  Service: Cardiovascular;  Laterality: N/A;  . CERVICAL DISC ARTHROPLASTY     x 2  . MOHS SURGERY    . PROSTATE BIOPSY    . TONSILLECTOMY      ROS:    *** PHYSICAL EXAM There were no vitals taken for this visit.  GENERAL:  Well appearing NECK:  No jugular venous distention, waveform within normal limits, carotid upstroke brisk and symmetric, no bruits, no thyromegaly LUNGS:  Clear to auscultation bilaterally BACK:  No CVA tenderness CHEST:  Unremarkable HEART:  PMI not displaced or sustained,S1 and S2 within normal limits, no S3, no S4, no clicks, no rubs, *** murmurs ABD:  Flat, positive bowel sounds normal in frequency in pitch, no bruits, no rebound, no guarding, no midline pulsatile mass, no hepatomegaly, no splenomegaly EXT:  2 plus pulses throughout, no edema, no cyanosis no clubbing   GENERAL:  Well appearing NECK:  No jugular venous distention, waveform within normal limits, carotid upstroke brisk and symmetric, no bruits, no thyromegaly LUNGS:  Clear to auscultation bilaterally BACK:  No CVA tenderness CHEST:  Unremarkable HEART:  PMI not displaced or sustained,S1 and S2 within  normal limits, no S3, no clicks, no rubs, no murmurs, irregular ABD:  Flat, positive bowel sounds normal in frequency in pitch, no bruits, no rebound, no guarding, no midline pulsatile mass, no hepatomegaly, no splenomegaly EXT:  2 plus pulses throughout, mild edema, no cyanosis no clubbing  EKG:  Atrial fibrillation, rate ***, axis within normal limits, intervals within normal limits, no acute ST-T wave changes. Poor anterior R wave progression. 11/02/2016  ASSESSMENT AND PLAN  ATRIAL FIB:  [name] has a CHA2DS2 - VASc score of 3 with a risk of stroke of 3.2%.  *** He tolerates anticoagulation.   We'll long  discussion about this. He does not feel his fibrillation. He's now been cardioverted twice with recurrent fibrillation. Given this no change in therapy is suggested and he will just pursue rate control and anticoagulation. I'm going to place a 24-hour Holter. No continue on his meds as listed.  HTN:     *** The blood pressure is at target. No change in medications is indicated. We will continue with therapeutic lifestyle changes (TLC).

## 2016-11-03 ENCOUNTER — Ambulatory Visit: Payer: Medicare Other | Admitting: Cardiology

## 2016-11-04 DIAGNOSIS — B079 Viral wart, unspecified: Secondary | ICD-10-CM | POA: Diagnosis not present

## 2016-11-04 DIAGNOSIS — L570 Actinic keratosis: Secondary | ICD-10-CM | POA: Diagnosis not present

## 2016-11-04 DIAGNOSIS — D485 Neoplasm of uncertain behavior of skin: Secondary | ICD-10-CM | POA: Diagnosis not present

## 2016-11-10 ENCOUNTER — Encounter (HOSPITAL_COMMUNITY): Payer: Self-pay | Admitting: Nurse Practitioner

## 2016-11-10 ENCOUNTER — Ambulatory Visit (HOSPITAL_COMMUNITY)
Admission: RE | Admit: 2016-11-10 | Discharge: 2016-11-10 | Disposition: A | Discharge status: Home / Self Care | Payer: Medicare Other | Source: Ambulatory Visit | Attending: Nurse Practitioner | Admitting: Nurse Practitioner

## 2016-11-10 VITALS — BP 132/84 | Ht 70.0 in | Wt 217.0 lb

## 2016-11-10 DIAGNOSIS — Z87891 Personal history of nicotine dependence: Secondary | ICD-10-CM | POA: Diagnosis not present

## 2016-11-10 DIAGNOSIS — Z8249 Family history of ischemic heart disease and other diseases of the circulatory system: Secondary | ICD-10-CM | POA: Diagnosis not present

## 2016-11-10 DIAGNOSIS — Z803 Family history of malignant neoplasm of breast: Secondary | ICD-10-CM | POA: Diagnosis not present

## 2016-11-10 DIAGNOSIS — M109 Gout, unspecified: Secondary | ICD-10-CM | POA: Diagnosis not present

## 2016-11-10 DIAGNOSIS — E785 Hyperlipidemia, unspecified: Secondary | ICD-10-CM | POA: Diagnosis not present

## 2016-11-10 DIAGNOSIS — I4891 Unspecified atrial fibrillation: Secondary | ICD-10-CM | POA: Diagnosis not present

## 2016-11-10 DIAGNOSIS — I10 Essential (primary) hypertension: Secondary | ICD-10-CM | POA: Insufficient documentation

## 2016-11-10 DIAGNOSIS — Z885 Allergy status to narcotic agent status: Secondary | ICD-10-CM | POA: Insufficient documentation

## 2016-11-10 DIAGNOSIS — Z9889 Other specified postprocedural states: Secondary | ICD-10-CM | POA: Insufficient documentation

## 2016-11-10 DIAGNOSIS — Z85828 Personal history of other malignant neoplasm of skin: Secondary | ICD-10-CM | POA: Diagnosis not present

## 2016-11-10 DIAGNOSIS — I481 Persistent atrial fibrillation: Secondary | ICD-10-CM

## 2016-11-10 DIAGNOSIS — I4819 Other persistent atrial fibrillation: Secondary | ICD-10-CM

## 2016-11-10 DIAGNOSIS — Z7901 Long term (current) use of anticoagulants: Secondary | ICD-10-CM | POA: Diagnosis not present

## 2016-11-10 NOTE — Progress Notes (Signed)
Primary Care Physician: Wayne Cruel, MD Referring Physician: [name], PA Cardiologist: Dr. [name] is a 76 y.o. male with a h/o atrial fibrillation, carotid artery disease, Gilbert's disease, gout, hyperlipidemia and hypertension. He had a negative Myoview in 2006. Echocardiogram obtained on 02/2016 showed ejection fraction 55-60%, mildly dilated left atrium. He did undergo DCCV on  05/16/2014 with successful conversion to sinus rhythm after 2 shocks (120 J then 200 J). He was last seen by Dr. [name] on 08/27/2016 at which time he was back in afib and asymptomatic. It was decided that pt would live in afib. He is rate controlled without any chronotropic drug on board. and on xarelto for a chadsvasc score of at least 3.  Pt asked to come to afib clinic and discuss options for afib. He is still asymptomatic with his afib. He states that he can do activities all day and not have to stop for fatigue or shortness of breath. He recently saw a newsletter from [place] that discussed afib and ablation. He wanted to discuss if he was a candidate.  Today, he denies symptoms of palpitations, chest pain, shortness of breath, orthopnea, PND, lower extremity edema, dizziness, presyncope, syncope, or neurologic sequela. The patient is tolerating medications without difficulties and is otherwise without complaint today.   Past Medical History:  Diagnosis Date  . Atrial fibrillation ([place])   . C2 cervical fracture ([place])   . Gilbert's disease   . Gout   . Hyperlipidemia   . Hypertension   . Skin cancer    Past Surgical History:  Procedure Laterality Date  . CARDIOVERSION N/A 05/16/2014   Procedure: CARDIOVERSION;  Surgeon: [name], MD;  Location: [facility] ENDOSCOPY;  Service: Cardiovascular;  Laterality: N/A;  . CARDIOVERSION N/A 03/05/2016   Procedure: CARDIOVERSION;  Surgeon: [name], MD;  Location: [place];  Service: Cardiovascular;  Laterality: N/A;  .  CERVICAL DISC ARTHROPLASTY     x 2  . MOHS SURGERY    . PROSTATE BIOPSY    . TONSILLECTOMY      Current Outpatient Prescriptions  Medication Sig Dispense Refill  . acetaminophen (TYLENOL) 500 MG tablet Take 500-1,000 mg by mouth every 6 (six) hours as needed for moderate pain.    Marland Kitchen colchicine 0.6 MG tablet Take 0.6 mg by mouth as needed. gout  0  . CRESTOR 5 MG tablet Take 5 mg by mouth daily.     Marland Kitchen lisinopril (PRINIVIL,ZESTRIL) 20 MG tablet Take 20 mg by mouth daily.    . rivaroxaban (XARELTO) 20 MG TABS tablet TAKE 1 TABLET(20 MG) BY MOUTH DAILY 90 tablet 3  . tamsulosin (FLOMAX) 0.4 MG CAPS capsule Take 0.4 mg by mouth daily.      No current facility-administered medications for this encounter.     Allergies  Allergen Reactions  . Codeine Nausea And Vomiting    Social History   Social History  . Marital status: Married    Spouse name: N/A  . Number of children: 2  . Years of education: 12   Occupational History  . Retired     Social History Main Topics  . Smoking status: Former Smoker    Types: Cigarettes  . Smokeless tobacco: Never Used     Comment: Quit 33 years ago.  . Alcohol use 1.8 oz/week    3 Standard drinks or equivalent per week  . Drug use: No  . Sexual activity: Not on file  Other Topics Concern  . Not on file   Social History Narrative   Lives at home with wife.  He has 3 step children.    Drinks 2 cups of coffee a day and 2 cups of tea a day     Family History  Problem Relation Age of Onset  . Cancer Mother        Breast  . Heart attack Father 70       Died age 22 of MI    ROS- All systems are reviewed and negative except as per the HPI above  Physical Exam: Vitals:   11/10/16 1045  BP: 132/84  Weight: 217 lb (98.4 kg)  Height: 5\' 10"  (1.778 m)    GEN- The patient is well appearing, alert and oriented x 3 today.   Head- normocephalic, atraumatic Eyes-  Sclera clear, conjunctiva pink Ears- hearing intact Oropharynx-  clear Neck- supple, no JVP Lymph- no cervical lymphadenopathy Lungs- Clear to ausculation bilaterally, normal work of breathing Heart- irregular rate and rhythm, no murmurs, rubs or gallops, PMI not laterally displaced GI- soft, NT, ND, + BS Extremities- no clubbing, cyanosis, or edema MS- no significant deformity or atrophy Skin- no rash or lesion Psych- euthymic mood, full affect Neuro- strength and sensation are intact  EKG- afib at 91 bpm, qrs int 82 bpm, qtc 418 ms Epic records reviewed Lab- TSH, free T4 02/17/16- WNL Echo-11/3/ 2017-Study Conclusions  - Left ventricle: The cavity size was normal. Wall thickness was   normal. Systolic function was normal. The estimated ejection   fraction was in the range of 55% to 60%. Wall motion was normal;   there were no regional wall motion abnormalities. - Left atrium: The atrium was mildly dilated. - Pericardium, extracardiac: A trivial pericardial effusion was   identified.  Assessment and Plan: 1. Persistent asymtomatic afib He has returned to afib now for the last 10 weeks Rate controlled without chronotropic drug on board He still reports that he is asymptomatic with good energy and no significant shortness of breath Explained to pt that he currently is not an ablation candidate as he has never tried a rhythm control strategy, he is persistent and he is asymptomatic Options for AAD therapy were discussed by he is not interested in taking more drugs, he is   slow in SR and would likely need tikosyn,  Continue xarelto 20 mg daily    Will cancel appointment with Wayne Howell 7/21 at pt request but rescheduled with Dr. [name] in 3 months afib clinic as needed    Wayne Howell. Wayne Howell, [facility] [address] [place], [place] [address] [phone]

## 2016-11-24 ENCOUNTER — Ambulatory Visit: Payer: Medicare Other | Admitting: Cardiology

## 2017-02-09 DIAGNOSIS — D1801 Hemangioma of skin and subcutaneous tissue: Secondary | ICD-10-CM | POA: Diagnosis not present

## 2017-02-09 DIAGNOSIS — D225 Melanocytic nevi of trunk: Secondary | ICD-10-CM | POA: Diagnosis not present

## 2017-02-09 DIAGNOSIS — L814 Other melanin hyperpigmentation: Secondary | ICD-10-CM | POA: Diagnosis not present

## 2017-02-09 DIAGNOSIS — L570 Actinic keratosis: Secondary | ICD-10-CM | POA: Diagnosis not present

## 2017-02-14 NOTE — Progress Notes (Signed)
HPI The patient presents for evaluation of  atrial fibrillation. He is status post cardioversion in the past.  However, he had recurrent atrial fib.  He was not having symptoms and I decided on rate control and anticoagulation.  Since I saw him he was seen in the atrial fib clinic because he wanted to discuss ablation.  He was told that he would first need to fail rhythm management with meds but he did not want to try this.  He returns for follow up.  He is thinking of getting back to exercise.  I have suggested pool exercises as he has back problems.  The patient denies any new symptoms such as chest discomfort, neck or arm discomfort. There has been no new shortness of breath, PND or orthopnea. There have been no reported palpitations, presyncope or syncope.  Allergies  Allergen Reactions  . Codeine Nausea And Vomiting    Current Outpatient Prescriptions  Medication Sig Dispense Refill  . acetaminophen (TYLENOL) 500 MG tablet Take 500-1,000 mg by mouth every 6 (six) hours as needed for moderate pain.    Marland Kitchen colchicine 0.6 MG tablet Take 0.6 mg by mouth as needed. gout  0  . CRESTOR 5 MG tablet Take 5 mg by mouth daily.     Marland Kitchen lisinopril (PRINIVIL,ZESTRIL) 20 MG tablet Take 20 mg by mouth daily.    . rivaroxaban (XARELTO) 20 MG TABS tablet TAKE 1 TABLET(20 MG) BY MOUTH DAILY 90 tablet 3  . tamsulosin (FLOMAX) 0.4 MG CAPS capsule Take 0.4 mg by mouth daily.      No current facility-administered medications for this visit.     Past Medical History:  Diagnosis Date  . Atrial fibrillation ([place])   . C2 cervical fracture ([place])   . Gilbert's disease   . Gout   . Hyperlipidemia   . Hypertension   . Skin cancer     Past Surgical History:  Procedure Laterality Date  . CARDIOVERSION N/A 05/16/2014   Procedure: CARDIOVERSION;  Surgeon: [name], MD;  Location: [facility] ENDOSCOPY;  Service: Cardiovascular;  Laterality: N/A;  . CARDIOVERSION N/A 03/05/2016   Procedure: CARDIOVERSION;  Surgeon:  [name], MD;  Location: [place];  Service: Cardiovascular;  Laterality: N/A;  . CERVICAL DISC ARTHROPLASTY     x 2  . MOHS SURGERY    . PROSTATE BIOPSY    . TONSILLECTOMY      ROS:    As stated in the HPI and negative for all other systems.  PHYSICAL EXAM BP 106/70   Pulse 84   Ht 5\' 10"  (1.778 m)   Wt 217 lb 12.8 oz (98.8 kg)   BMI 31.25 kg/m   GENERAL:  Well appearing NECK:  No jugular venous distention, waveform within normal limits, carotid upstroke brisk and symmetric, no bruits, no thyromegaly LUNGS:  Clear to auscultation bilaterally CHEST:  Unremarkable HEART:  PMI not displaced or sustained,S1 and S2 within normal limits, no S3,  no clicks, no rubs, no murmurs, irregular ABD:  Flat, positive bowel sounds normal in frequency in pitch, no bruits, no rebound, no guarding, no midline pulsatile mass, no hepatomegaly, no splenomegaly EXT:  2 plus pulses throughout, no edema, no cyanosis no clubbing    ASSESSMENT AND PLAN  ATRIAL FIB:  Mr. LOCHLANN MASTRANGELO has a CHA2DS2 - VASc score of 3 with a risk of stroke of 3.2%. He tolerates anticoagulation.    We had a long discussion about this.  He will keep an  eye on his HR as he increases his activity.    HTN:     The blood pressure is at target. No change in medications is indicated. We will continue with therapeutic lifestyle changes (TLC).

## 2017-02-15 ENCOUNTER — Ambulatory Visit (INDEPENDENT_AMBULATORY_CARE_PROVIDER_SITE_OTHER): Payer: Medicare Other | Admitting: Cardiology

## 2017-02-15 ENCOUNTER — Encounter: Payer: Self-pay | Admitting: Cardiology

## 2017-02-15 VITALS — BP 106/70 | HR 84 | Ht 70.0 in | Wt 217.8 lb

## 2017-02-15 DIAGNOSIS — I482 Chronic atrial fibrillation, unspecified: Secondary | ICD-10-CM

## 2017-02-15 DIAGNOSIS — I10 Essential (primary) hypertension: Secondary | ICD-10-CM | POA: Diagnosis not present

## 2017-02-15 NOTE — Patient Instructions (Signed)
Medication Instructions:  Continue current medications  If you need a refill on your cardiac medications before your next appointment, please call your pharmacy.  Labwork: None Ordered   Testing/Procedures: None Ordered  Follow-Up: Your physician wants you to follow-up in: 1 Year. You should receive a reminder letter in the mail two months in advance. If you do not receive a letter, please call our office [phone].    Thank you for choosing [facility]!!      

## 2017-02-16 ENCOUNTER — Encounter: Payer: Self-pay | Admitting: Cardiology

## 2017-07-19 DIAGNOSIS — G894 Chronic pain syndrome: Secondary | ICD-10-CM | POA: Diagnosis not present

## 2017-07-19 DIAGNOSIS — Z79891 Long term (current) use of opiate analgesic: Secondary | ICD-10-CM | POA: Diagnosis not present

## 2017-07-19 DIAGNOSIS — M791 Myalgia, unspecified site: Secondary | ICD-10-CM | POA: Diagnosis not present

## 2017-07-19 DIAGNOSIS — M47816 Spondylosis without myelopathy or radiculopathy, lumbar region: Secondary | ICD-10-CM | POA: Diagnosis not present

## 2017-07-19 DIAGNOSIS — M5136 Other intervertebral disc degeneration, lumbar region: Secondary | ICD-10-CM | POA: Diagnosis not present

## 2017-07-27 DIAGNOSIS — R3915 Urgency of urination: Secondary | ICD-10-CM | POA: Diagnosis not present

## 2017-08-09 DIAGNOSIS — M5136 Other intervertebral disc degeneration, lumbar region: Secondary | ICD-10-CM | POA: Diagnosis not present

## 2017-08-09 DIAGNOSIS — G894 Chronic pain syndrome: Secondary | ICD-10-CM | POA: Diagnosis not present

## 2017-08-09 DIAGNOSIS — M47816 Spondylosis without myelopathy or radiculopathy, lumbar region: Secondary | ICD-10-CM | POA: Diagnosis not present

## 2017-08-10 DIAGNOSIS — M545 Low back pain: Secondary | ICD-10-CM | POA: Diagnosis not present

## 2017-08-12 DIAGNOSIS — M48061 Spinal stenosis, lumbar region without neurogenic claudication: Secondary | ICD-10-CM | POA: Diagnosis not present

## 2017-08-12 DIAGNOSIS — M5126 Other intervertebral disc displacement, lumbar region: Secondary | ICD-10-CM | POA: Diagnosis not present

## 2017-08-12 DIAGNOSIS — M545 Low back pain: Secondary | ICD-10-CM | POA: Diagnosis not present

## 2017-08-18 DIAGNOSIS — M545 Low back pain: Secondary | ICD-10-CM | POA: Diagnosis not present

## 2017-08-23 DIAGNOSIS — M545 Low back pain: Secondary | ICD-10-CM | POA: Diagnosis not present

## 2017-08-24 DIAGNOSIS — M47816 Spondylosis without myelopathy or radiculopathy, lumbar region: Secondary | ICD-10-CM | POA: Diagnosis not present

## 2017-08-24 DIAGNOSIS — M47817 Spondylosis without myelopathy or radiculopathy, lumbosacral region: Secondary | ICD-10-CM | POA: Diagnosis not present

## 2017-08-25 DIAGNOSIS — M109 Gout, unspecified: Secondary | ICD-10-CM | POA: Diagnosis not present

## 2017-08-25 DIAGNOSIS — E7800 Pure hypercholesterolemia, unspecified: Secondary | ICD-10-CM | POA: Diagnosis not present

## 2017-08-25 DIAGNOSIS — I10 Essential (primary) hypertension: Secondary | ICD-10-CM | POA: Diagnosis not present

## 2017-08-25 DIAGNOSIS — M545 Low back pain: Secondary | ICD-10-CM | POA: Diagnosis not present

## 2017-08-25 DIAGNOSIS — Z0000 Encounter for general adult medical examination without abnormal findings: Secondary | ICD-10-CM | POA: Diagnosis not present

## 2017-09-01 DIAGNOSIS — I10 Essential (primary) hypertension: Secondary | ICD-10-CM | POA: Diagnosis not present

## 2017-09-01 DIAGNOSIS — E7800 Pure hypercholesterolemia, unspecified: Secondary | ICD-10-CM | POA: Diagnosis not present

## 2017-09-01 DIAGNOSIS — Z0000 Encounter for general adult medical examination without abnormal findings: Secondary | ICD-10-CM | POA: Diagnosis not present

## 2017-09-01 DIAGNOSIS — M109 Gout, unspecified: Secondary | ICD-10-CM | POA: Diagnosis not present

## 2017-09-27 DIAGNOSIS — D0472 Carcinoma in situ of skin of left lower limb, including hip: Secondary | ICD-10-CM | POA: Diagnosis not present

## 2017-09-27 DIAGNOSIS — L719 Rosacea, unspecified: Secondary | ICD-10-CM | POA: Diagnosis not present

## 2017-09-27 DIAGNOSIS — D485 Neoplasm of uncertain behavior of skin: Secondary | ICD-10-CM | POA: Diagnosis not present

## 2017-09-27 DIAGNOSIS — L570 Actinic keratosis: Secondary | ICD-10-CM | POA: Diagnosis not present

## 2017-10-13 ENCOUNTER — Other Ambulatory Visit: Payer: Self-pay | Admitting: Cardiology

## 2017-10-13 NOTE — Telephone Encounter (Signed)
Rx request sent to pharmacy.  

## 2017-10-21 DIAGNOSIS — G894 Chronic pain syndrome: Secondary | ICD-10-CM | POA: Diagnosis not present

## 2017-10-21 DIAGNOSIS — M5136 Other intervertebral disc degeneration, lumbar region: Secondary | ICD-10-CM | POA: Diagnosis not present

## 2017-10-21 DIAGNOSIS — M47816 Spondylosis without myelopathy or radiculopathy, lumbar region: Secondary | ICD-10-CM | POA: Diagnosis not present

## 2017-11-08 DIAGNOSIS — D0472 Carcinoma in situ of skin of left lower limb, including hip: Secondary | ICD-10-CM | POA: Diagnosis not present

## 2017-12-22 DIAGNOSIS — L570 Actinic keratosis: Secondary | ICD-10-CM | POA: Diagnosis not present

## 2017-12-22 DIAGNOSIS — S71109A Unspecified open wound, unspecified thigh, initial encounter: Secondary | ICD-10-CM | POA: Diagnosis not present

## 2018-01-15 ENCOUNTER — Other Ambulatory Visit: Payer: Self-pay | Admitting: Cardiology

## 2018-01-20 DIAGNOSIS — M79605 Pain in left leg: Secondary | ICD-10-CM | POA: Diagnosis not present

## 2018-02-07 DIAGNOSIS — H2513 Age-related nuclear cataract, bilateral: Secondary | ICD-10-CM | POA: Diagnosis not present

## 2018-02-07 DIAGNOSIS — H52201 Unspecified astigmatism, right eye: Secondary | ICD-10-CM | POA: Diagnosis not present

## 2018-02-07 DIAGNOSIS — H5213 Myopia, bilateral: Secondary | ICD-10-CM | POA: Diagnosis not present

## 2018-02-07 DIAGNOSIS — H524 Presbyopia: Secondary | ICD-10-CM | POA: Diagnosis not present

## 2018-02-23 NOTE — Progress Notes (Signed)
HPI The patient presents for evaluation of  atrial fibrillation. He is status post cardioversion in the past.  However, he had recurrent atrial fib.  He was not having symptoms and I decided on rate control and anticoagulation.  Since I saw him he has done well.  He is limited because of his back pain.  He does some grocery shopping 3 or 4 times a week. The patient denies any new symptoms such as chest discomfort, neck or arm discomfort. There has been no new shortness of breath, PND or orthopnea. There have been no reported palpitations, presyncope or syncope.  He does not really notice his fibrillation.    Allergies  Allergen Reactions  . Codeine Nausea And Vomiting    Current Outpatient Medications  Medication Sig Dispense Refill  . acetaminophen (TYLENOL) 500 MG tablet Take 500-1,000 mg by mouth every 6 (six) hours as needed for moderate pain.    Marland Kitchen colchicine 0.6 MG tablet Take 0.6 mg by mouth as needed. gout  0  . CRESTOR 5 MG tablet Take 5 mg by mouth daily.     Marland Kitchen lisinopril (PRINIVIL,ZESTRIL) 20 MG tablet Take 20 mg by mouth daily.    . tamsulosin (FLOMAX) 0.4 MG CAPS capsule Take 0.4 mg by mouth daily.     Alveda Reasons 20 MG TABS tablet TAKE 1 TABLET(20 MG) BY MOUTH DAILY 90 tablet 1   No current facility-administered medications for this visit.     Past Medical History:  Diagnosis Date  . Atrial fibrillation ([place])   . C2 cervical fracture ([place])   . Gilbert's disease   . Gout   . Hyperlipidemia   . Hypertension   . Skin cancer     Past Surgical History:  Procedure Laterality Date  . CARDIOVERSION N/A 05/16/2014   Procedure: CARDIOVERSION;  Surgeon: [name], MD;  Location: [facility] ENDOSCOPY;  Service: Cardiovascular;  Laterality: N/A;  . CARDIOVERSION N/A 03/05/2016   Procedure: CARDIOVERSION;  Surgeon: [name], MD;  Location: [place];  Service: Cardiovascular;  Laterality: N/A;  . CERVICAL DISC ARTHROPLASTY     x 2  . MOHS SURGERY    . PROSTATE BIOPSY     . TONSILLECTOMY      ROS:    Back pain.  Otherwise as stated in the HPI and negative for all other systems.  PHYSICAL EXAM BP 110/60   Pulse 87   Ht 5\' 10"  (1.778 m)   Wt 211 lb (95.7 kg)   BMI 30.28 kg/m   GENERAL:  Well appearing NECK:  No jugular venous distention, waveform within normal limits, carotid upstroke brisk and symmetric, no bruits, no thyromegaly LUNGS:  Clear to auscultation bilaterally CHEST:  Unremarkable HEART:  PMI not displaced or sustained,S1 and S2 within normal limits, no S3, no clicks, no rubs, no murmurs, atrial fib ABD:  Flat, positive bowel sounds normal in frequency in pitch, no bruits, no rebound, no guarding, no midline pulsatile mass, no hepatomegaly, no splenomegaly EXT:  2 plus pulses throughout, mild edema, no cyanosis no clubbing   EKG: Atrial fibrillation, rate 87, axis within normal limits intervals within normal limits, poor anterior R wave progression, no acute ST-T wave changes. 02/25/2018    ASSESSMENT AND PLAN  ATRIAL FIB:  [name] has a CHA2DS2 - VASc score of 3 with a risk of stroke of 3.2%.  I will check a CBC.  Otherwise, no change in therapy.   HTN:  The blood pressure is at target.  No change in therapy.

## 2018-02-25 ENCOUNTER — Ambulatory Visit: Payer: Medicare Other | Admitting: Cardiology

## 2018-02-25 ENCOUNTER — Encounter: Payer: Self-pay | Admitting: Cardiology

## 2018-02-25 VITALS — BP 110/60 | HR 87 | Ht 70.0 in | Wt 211.0 lb

## 2018-02-25 DIAGNOSIS — I4891 Unspecified atrial fibrillation: Secondary | ICD-10-CM

## 2018-02-25 DIAGNOSIS — Z79899 Other long term (current) drug therapy: Secondary | ICD-10-CM

## 2018-02-25 NOTE — Patient Instructions (Signed)
Medication Instructions:  Continue current medications  If you need a refill on your cardiac medications before your next appointment, please call your pharmacy.  Labwork: CBC Today HERE IN OUR OFFICE AT [facility]  Take the provided lab slips with you to the lab for your blood draw.   You will NOT need to fast   If you have labs (blood work) drawn today and your tests are completely normal, you will receive your results only by: Marland Kitchen MyChart Message (if you have MyChart) OR . A paper copy in the mail If you have any lab test that is abnormal or we need to change your treatment, we will call you to review the results.  Testing/Procedures: None Ordered  Follow-Up: You will need a follow up appointment in 1 Year.  Please call our office 2 months in [phone]) to schedule the (1 Year) appointment.  You may see  DR Percival Spanish or one of the following Advanced Practice Providers on your designated Care Team:   . Jory Sims, DNP, ANP . Rhonda Barrett, PA-C .  Marland Kitchen Kerin Ransom, PA-C . Daleen Snook Kroeger, PA-C . Sande Rives, PA-C .  Marland Kitchen [name], PA-C . Fabian Sharp, PA-C  At [facility], you and your health needs are our priority.  As part of our continuing mission to provide you with exceptional heart care, we have created designated Provider Care Teams.  These Care Teams include your primary Cardiologist (physician) and Advanced Practice Providers (APPs -  Physician Assistants and Nurse Practitioners) who all work together to provide you with the care you need, when you need it.   Thank you for choosing [facility]!!

## 2018-02-26 LAB — CBC
HEMATOCRIT: 45.3 % (ref 37.5–51.0)
HEMOGLOBIN: 15.3 g/dL (ref 13.0–17.7)
MCH: 31.4 pg (ref 26.6–33.0)
MCHC: 33.8 g/dL (ref 31.5–35.7)
MCV: 93 fL (ref 79–97)
Platelets: 126 10*3/uL — ABNORMAL LOW (ref 150–450)
RBC: 4.88 x10E6/uL (ref 4.14–5.80)
RDW: 11.8 % — ABNORMAL LOW (ref 12.3–15.4)
WBC: 4.9 10*3/uL (ref 3.4–10.8)

## 2018-03-02 DIAGNOSIS — H2512 Age-related nuclear cataract, left eye: Secondary | ICD-10-CM | POA: Diagnosis not present

## 2018-03-02 DIAGNOSIS — H2511 Age-related nuclear cataract, right eye: Secondary | ICD-10-CM | POA: Diagnosis not present

## 2018-04-19 DIAGNOSIS — L570 Actinic keratosis: Secondary | ICD-10-CM | POA: Diagnosis not present

## 2018-04-19 DIAGNOSIS — L0100 Impetigo, unspecified: Secondary | ICD-10-CM | POA: Diagnosis not present

## 2018-04-19 DIAGNOSIS — R05 Cough: Secondary | ICD-10-CM | POA: Diagnosis not present

## 2018-04-28 ENCOUNTER — Other Ambulatory Visit: Payer: Self-pay | Admitting: Cardiology

## 2018-06-06 DIAGNOSIS — R0989 Other specified symptoms and signs involving the circulatory and respiratory systems: Secondary | ICD-10-CM | POA: Diagnosis not present

## 2018-06-06 DIAGNOSIS — R062 Wheezing: Secondary | ICD-10-CM | POA: Diagnosis not present

## 2018-06-14 DIAGNOSIS — L300 Nummular dermatitis: Secondary | ICD-10-CM | POA: Diagnosis not present

## 2018-06-14 DIAGNOSIS — L821 Other seborrheic keratosis: Secondary | ICD-10-CM | POA: Diagnosis not present

## 2018-06-14 DIAGNOSIS — D485 Neoplasm of uncertain behavior of skin: Secondary | ICD-10-CM | POA: Diagnosis not present

## 2018-06-14 DIAGNOSIS — L853 Xerosis cutis: Secondary | ICD-10-CM | POA: Diagnosis not present

## 2018-06-14 DIAGNOSIS — L570 Actinic keratosis: Secondary | ICD-10-CM | POA: Diagnosis not present

## 2018-07-06 DIAGNOSIS — R062 Wheezing: Secondary | ICD-10-CM | POA: Diagnosis not present

## 2018-07-06 DIAGNOSIS — Z09 Encounter for follow-up examination after completed treatment for conditions other than malignant neoplasm: Secondary | ICD-10-CM | POA: Diagnosis not present

## 2018-07-17 ENCOUNTER — Other Ambulatory Visit: Payer: Self-pay | Admitting: Cardiology

## 2018-09-21 DIAGNOSIS — I10 Essential (primary) hypertension: Secondary | ICD-10-CM | POA: Diagnosis not present

## 2018-09-21 DIAGNOSIS — E7800 Pure hypercholesterolemia, unspecified: Secondary | ICD-10-CM | POA: Diagnosis not present

## 2018-09-21 DIAGNOSIS — I4891 Unspecified atrial fibrillation: Secondary | ICD-10-CM | POA: Diagnosis not present

## 2018-09-21 DIAGNOSIS — Z0000 Encounter for general adult medical examination without abnormal findings: Secondary | ICD-10-CM | POA: Diagnosis not present

## 2018-09-27 DIAGNOSIS — M6281 Muscle weakness (generalized): Secondary | ICD-10-CM | POA: Diagnosis not present

## 2018-09-27 DIAGNOSIS — M545 Low back pain: Secondary | ICD-10-CM | POA: Diagnosis not present

## 2018-09-27 DIAGNOSIS — R262 Difficulty in walking, not elsewhere classified: Secondary | ICD-10-CM | POA: Diagnosis not present

## 2018-09-30 DIAGNOSIS — M545 Low back pain: Secondary | ICD-10-CM | POA: Diagnosis not present

## 2018-09-30 DIAGNOSIS — R262 Difficulty in walking, not elsewhere classified: Secondary | ICD-10-CM | POA: Diagnosis not present

## 2018-09-30 DIAGNOSIS — M6281 Muscle weakness (generalized): Secondary | ICD-10-CM | POA: Diagnosis not present

## 2018-10-05 DIAGNOSIS — M6281 Muscle weakness (generalized): Secondary | ICD-10-CM | POA: Diagnosis not present

## 2018-10-05 DIAGNOSIS — R262 Difficulty in walking, not elsewhere classified: Secondary | ICD-10-CM | POA: Diagnosis not present

## 2018-10-05 DIAGNOSIS — M545 Low back pain: Secondary | ICD-10-CM | POA: Diagnosis not present

## 2018-10-12 DIAGNOSIS — R262 Difficulty in walking, not elsewhere classified: Secondary | ICD-10-CM | POA: Diagnosis not present

## 2018-10-12 DIAGNOSIS — M6281 Muscle weakness (generalized): Secondary | ICD-10-CM | POA: Diagnosis not present

## 2018-10-12 DIAGNOSIS — M545 Low back pain: Secondary | ICD-10-CM | POA: Diagnosis not present

## 2018-10-17 DIAGNOSIS — R262 Difficulty in walking, not elsewhere classified: Secondary | ICD-10-CM | POA: Diagnosis not present

## 2018-10-17 DIAGNOSIS — M545 Low back pain: Secondary | ICD-10-CM | POA: Diagnosis not present

## 2018-10-17 DIAGNOSIS — M6281 Muscle weakness (generalized): Secondary | ICD-10-CM | POA: Diagnosis not present

## 2018-10-19 DIAGNOSIS — M6281 Muscle weakness (generalized): Secondary | ICD-10-CM | POA: Diagnosis not present

## 2018-10-19 DIAGNOSIS — R262 Difficulty in walking, not elsewhere classified: Secondary | ICD-10-CM | POA: Diagnosis not present

## 2018-10-19 DIAGNOSIS — M545 Low back pain: Secondary | ICD-10-CM | POA: Diagnosis not present

## 2018-11-07 DIAGNOSIS — R3915 Urgency of urination: Secondary | ICD-10-CM | POA: Diagnosis not present

## 2018-11-07 DIAGNOSIS — R3912 Poor urinary stream: Secondary | ICD-10-CM | POA: Diagnosis not present

## 2018-12-20 DIAGNOSIS — D485 Neoplasm of uncertain behavior of skin: Secondary | ICD-10-CM | POA: Diagnosis not present

## 2018-12-20 DIAGNOSIS — C4442 Squamous cell carcinoma of skin of scalp and neck: Secondary | ICD-10-CM | POA: Diagnosis not present

## 2019-01-09 ENCOUNTER — Other Ambulatory Visit: Payer: Self-pay

## 2019-01-09 MED ORDER — RIVAROXABAN 20 MG PO TABS: ORAL_TABLET | ORAL | 0 refills | Status: DC

## 2019-01-12 DIAGNOSIS — C4442 Squamous cell carcinoma of skin of scalp and neck: Secondary | ICD-10-CM | POA: Diagnosis not present

## 2019-03-02 ENCOUNTER — Telehealth: Payer: Self-pay | Admitting: *Deleted

## 2019-03-02 NOTE — Telephone Encounter (Signed)
A message was left, re: his follow up visit. 

## 2019-03-29 DIAGNOSIS — Z859 Personal history of malignant neoplasm, unspecified: Secondary | ICD-10-CM | POA: Diagnosis not present

## 2019-03-29 DIAGNOSIS — L308 Other specified dermatitis: Secondary | ICD-10-CM | POA: Diagnosis not present

## 2019-03-29 DIAGNOSIS — L988 Other specified disorders of the skin and subcutaneous tissue: Secondary | ICD-10-CM | POA: Diagnosis not present

## 2019-03-29 DIAGNOSIS — L570 Actinic keratosis: Secondary | ICD-10-CM | POA: Diagnosis not present

## 2019-04-04 ENCOUNTER — Other Ambulatory Visit: Payer: Self-pay | Admitting: Pharmacist

## 2019-04-04 MED ORDER — RIVAROXABAN 20 MG PO TABS: ORAL_TABLET | ORAL | 0 refills | Status: DC

## 2019-04-09 DIAGNOSIS — I10 Essential (primary) hypertension: Secondary | ICD-10-CM | POA: Insufficient documentation

## 2019-04-09 NOTE — Progress Notes (Signed)
Virtual Visit via Video Note   This visit type was conducted due to national recommendations for restrictions regarding the COVID-19 Pandemic (e.g. social distancing) in an effort to limit this patient's exposure and mitigate transmission in our community.  Due to his co-morbid illnesses, this patient is at least at moderate risk for complications without adequate follow up.  This format is felt to be most appropriate for this patient at this time.  All issues noted in this document were discussed and addressed.  A limited physical exam was performed with this format.  Please refer to the patient's chart for his consent to telehealth for [facility]   Date:  04/10/2019   ID:  Wayne Howell, DOB 06/02/1940, MRN [id number]  Patient Location: Home Provider Location: Home  PCP:  [name], MD  Cardiologist:  Minus Breeding, MD  Electrophysiologist:  None   Evaluation Performed:  Follow-Up Visit  Chief Complaint:  Atrial fib   History of Present Illness:    Wayne Howell is a 78 y.o. male for evaluation of  atrial fibrillation. He is status post cardioversion in the past.  However, he had recurrent atrial fib.  He does not know when his heart is out of rhythm.  He does not feel any tachypalpitations.  He said no presyncope or syncope.  He is moving to a new home.  He tolerates anticoagulation.  He has had no evident bleeding.  He denies any chest pressure, neck or arm discomfort.  He said no weight gain or edema.  He denies any new shortness of breath, PND or orthopnea.  The patient does not have symptoms concerning for COVID-19 infection (fever, chills, cough, or new shortness of breath).    Past Medical History:  Diagnosis Date  . Atrial fibrillation ([employer])   . C2 cervical fracture ([place])   . Gilbert's disease   . Gout   . Hyperlipidemia   . Hypertension   . Skin cancer    Past Surgical History:  Procedure Laterality Date  . CARDIOVERSION N/A 05/16/2014   Procedure: CARDIOVERSION;  Surgeon: [name], MD;  Location: [facility] ENDOSCOPY;  Service: Cardiovascular;  Laterality: N/A;  . CARDIOVERSION N/A 03/05/2016   Procedure: CARDIOVERSION;  Surgeon: [name], MD;  Location: [place];  Service: Cardiovascular;  Laterality: N/A;  . CERVICAL DISC ARTHROPLASTY     x 2  . MOHS SURGERY    . PROSTATE BIOPSY    . TONSILLECTOMY       Current Meds  Medication Sig  . acetaminophen (TYLENOL) 500 MG tablet Take 500-1,000 mg by mouth every 6 (six) hours as needed for moderate pain.  Marland Kitchen colchicine 0.6 MG tablet Take 0.6 mg by mouth as needed. gout  . CRESTOR 5 MG tablet Take 5 mg by mouth daily.   Marland Kitchen lisinopril (PRINIVIL,ZESTRIL) 20 MG tablet Take 20 mg by mouth daily.  . rivaroxaban (XARELTO) 20 MG TABS tablet TAKE 1 TABLET(20 MG) BY MOUTH DAILY. LAST REFILL UNTIL CARDIOLOGIST FOLLOW UP.  . tamsulosin (FLOMAX) 0.4 MG CAPS capsule Take 0.4 mg by mouth daily.      Allergies:   Codeine   Social History   Tobacco Use  . Smoking status: Former Smoker    Types: Cigarettes  . Smokeless tobacco: Never Used  . Tobacco comment: Quit 33 years ago.  Substance Use Topics  . Alcohol use: Yes    Alcohol/week: 3.0 standard drinks    Types: 3 Standard drinks or equivalent per week  .  Drug use: No     Family Hx: The patient's family history includes Cancer in his mother; Heart attack (age of onset: 34) in his father.  ROS:   Please see the history of present illness.     All other systems reviewed and are negative.   Prior CV studies:   The following studies were reviewed today:  None.   Labs/Other Tests and Data Reviewed:    EKG:  No ECG reviewed.  Recent Labs: No results found for requested labs within last 8760 hours.   Recent Lipid Panel No results found for: CHOL, TRIG, HDL, CHOLHDL, LDLCALC, LDLDIRECT  Wt Readings from Last 3 Encounters:  02/25/18 211 lb (95.7 kg)  02/15/17 217 lb 12.8 oz (98.8 kg)  11/10/16 217 lb (98.4 kg)       Objective:    Vital Signs:  There were no vitals taken for this visit.   VITAL SIGNS:  reviewed GEN:  no acute distress EYES:  sclerae anicteric, EOMI - Extraocular Movements Intact NEURO:  alert and oriented x 3, no obvious focal deficit PSYCH:  normal affect  ASSESSMENT & PLAN:     ATRIAL FIB:  Mr. KOBIE BROTMAN has a CHA2DS2 - VASc score of 3 .   He tolerates anticoagulation.  He says he had blood work done earlier this year by his primary provider but I unfortunately do not have access to this.  He understands I would like to make sure he at least gets blood work done once yearly to check a CBC.  He will continue the other meds as listed.  We talked about getting a pulse ox so that he can better track his heart rate.  He will look into this.   HTN:     The blood pressure is well controlled by his report though I do not have readings today.   He will follow this with his blood pressure cuff and I will be happy to review these.  COVID-19 Education: The signs and symptoms of COVID-19 were discussed with the patient and how to seek care for testing (follow up with PCP or arrange E-visit).  We talked about the vaccine.  The importance of social distancing was discussed today.  Time:   Today, I have spent 17 minutes with the patient with telehealth technology discussing the above problems.     Medication Adjustments/Labs and Tests Ordered: Current medicines are reviewed at length with the patient today.  Concerns regarding medicines are outlined above.   Tests Ordered: No orders of the defined types were placed in this encounter.   Medication Changes: No orders of the defined types were placed in this encounter.   Follow Up:  In Person in one year  Signed, Minus Breeding, MD  04/10/2019 4:12 PM    [facility]

## 2019-04-10 ENCOUNTER — Encounter: Payer: Self-pay | Admitting: Cardiology

## 2019-04-10 ENCOUNTER — Telehealth (INDEPENDENT_AMBULATORY_CARE_PROVIDER_SITE_OTHER): Payer: Medicare Other | Admitting: Cardiology

## 2019-04-10 ENCOUNTER — Telehealth: Payer: Self-pay

## 2019-04-10 DIAGNOSIS — I10 Essential (primary) hypertension: Secondary | ICD-10-CM

## 2019-04-10 DIAGNOSIS — I482 Chronic atrial fibrillation, unspecified: Secondary | ICD-10-CM

## 2019-04-10 DIAGNOSIS — I4891 Unspecified atrial fibrillation: Secondary | ICD-10-CM

## 2019-04-10 DIAGNOSIS — Z7901 Long term (current) use of anticoagulants: Secondary | ICD-10-CM

## 2019-04-10 NOTE — Patient Instructions (Signed)
Medication Instructions:  Your physician recommends that you continue on your current medications as directed. Please refer to the Current Medication list given to you today.  *If you need a refill on your cardiac medications before your next appointment, please call your pharmacy*  Lab Work: none If you have labs (blood work) drawn today and your tests are completely normal, you will receive your results only by: Marland Kitchen MyChart Message (if you have MyChart) OR . A paper copy in the mail If you have any lab test that is abnormal or we need to change your treatment, we will call you to review the results.  Testing/Procedures: none  Follow-Up: At [facility], you and your health needs are our priority.  As part of our continuing mission to provide you with exceptional heart care, we have created designated Provider Care Teams.  These Care Teams include your primary Cardiologist (physician) and Advanced Practice Providers (APPs -  Physician Assistants and Nurse Practitioners) who all work together to provide you with the care you need, when you need it.  Your next appointment:   12 month(s)  The format for your next appointment:   Either In Person or Virtual  Provider:   Minus Breeding, MD

## 2019-04-10 NOTE — Telephone Encounter (Signed)
Patient and/or DPR-approved person aware of AVS instructions and verbalized understanding.  Letter including After Visit Summary and any other necessary documents to be mailed to the patient's address on file.  Staff message sent to Medical Records pool to mail AVS to patient address.

## 2019-05-09 ENCOUNTER — Other Ambulatory Visit: Payer: Self-pay | Admitting: Cardiology

## 2019-05-11 ENCOUNTER — Ambulatory Visit: Payer: Medicare Other | Attending: Internal Medicine

## 2019-05-11 DIAGNOSIS — Z23 Encounter for immunization: Secondary | ICD-10-CM | POA: Insufficient documentation

## 2019-05-11 NOTE — Progress Notes (Signed)
   Covid-19 Vaccination Clinic  Name:  Wayne Howell    MRN: [id number] DOB: 1941/01/28  05/11/2019  Mr. [name] was observed post Covid-19 immunization for 30 minutes based on pre-vaccination screening without incidence. He was provided with Vaccine Information Sheet and instruction to access the V-Safe system.   Mr. [name] was instructed to call 911 with any severe reactions post vaccine: Marland Kitchen Difficulty breathing  . Swelling of your face and throat  . A fast heartbeat  . A bad rash all over your body  . Dizziness and weakness    Immunizations Administered    Name Date Dose VIS Date Route   Pfizer COVID-19 Vaccine 05/11/2019 11:01 AM 0.3 mL 04/07/2019 Intramuscular   Manufacturer: [employer], [employer]   Lot: [id number]   [place]: [id number]

## 2019-05-29 ENCOUNTER — Ambulatory Visit: Payer: Medicare Other | Attending: Internal Medicine

## 2019-05-29 ENCOUNTER — Ambulatory Visit: Payer: Medicare Other

## 2019-05-29 DIAGNOSIS — Z23 Encounter for immunization: Secondary | ICD-10-CM | POA: Insufficient documentation

## 2019-05-29 NOTE — Progress Notes (Signed)
   Covid-19 Vaccination Clinic  Name:  Wayne Howell    MRN: [id number] DOB: 1940/12/26  05/29/2019  [name] was observed post Covid-19 immunization for 15 minutes without incidence. He was provided with Vaccine Information Sheet and instruction to access the V-Safe system.   [name] was instructed to call 911 with any severe reactions post vaccine: Marland Kitchen Difficulty breathing  . Swelling of your face and throat  . A fast heartbeat  . A bad rash all over your body  . Dizziness and weakness    Immunizations Administered    Name Date Dose VIS Date Route   Pfizer COVID-19 Vaccine 05/29/2019 11:37 AM 0.3 mL 04/07/2019 Intramuscular   Manufacturer: [place]   Lot: [id number]   [place]: [id number]

## 2019-06-05 ENCOUNTER — Other Ambulatory Visit: Payer: Self-pay | Admitting: Cardiology

## 2019-06-29 DIAGNOSIS — I4891 Unspecified atrial fibrillation: Secondary | ICD-10-CM | POA: Diagnosis not present

## 2019-06-29 DIAGNOSIS — E7800 Pure hypercholesterolemia, unspecified: Secondary | ICD-10-CM | POA: Diagnosis not present

## 2019-06-29 DIAGNOSIS — I10 Essential (primary) hypertension: Secondary | ICD-10-CM | POA: Diagnosis not present

## 2019-09-27 DIAGNOSIS — D485 Neoplasm of uncertain behavior of skin: Secondary | ICD-10-CM | POA: Diagnosis not present

## 2019-09-27 DIAGNOSIS — R05 Cough: Secondary | ICD-10-CM | POA: Diagnosis not present

## 2019-09-27 DIAGNOSIS — E7800 Pure hypercholesterolemia, unspecified: Secondary | ICD-10-CM | POA: Diagnosis not present

## 2019-09-27 DIAGNOSIS — L404 Guttate psoriasis: Secondary | ICD-10-CM | POA: Diagnosis not present

## 2019-09-27 DIAGNOSIS — C4442 Squamous cell carcinoma of skin of scalp and neck: Secondary | ICD-10-CM | POA: Diagnosis not present

## 2019-09-27 DIAGNOSIS — L570 Actinic keratosis: Secondary | ICD-10-CM | POA: Diagnosis not present

## 2019-09-27 DIAGNOSIS — L988 Other specified disorders of the skin and subcutaneous tissue: Secondary | ICD-10-CM | POA: Diagnosis not present

## 2019-09-27 DIAGNOSIS — I10 Essential (primary) hypertension: Secondary | ICD-10-CM | POA: Diagnosis not present

## 2019-09-27 DIAGNOSIS — Z0000 Encounter for general adult medical examination without abnormal findings: Secondary | ICD-10-CM | POA: Diagnosis not present

## 2019-10-16 DIAGNOSIS — L570 Actinic keratosis: Secondary | ICD-10-CM | POA: Diagnosis not present

## 2019-10-16 DIAGNOSIS — C4442 Squamous cell carcinoma of skin of scalp and neck: Secondary | ICD-10-CM | POA: Diagnosis not present

## 2019-11-06 DIAGNOSIS — E7800 Pure hypercholesterolemia, unspecified: Secondary | ICD-10-CM | POA: Diagnosis not present

## 2019-11-06 DIAGNOSIS — I4891 Unspecified atrial fibrillation: Secondary | ICD-10-CM | POA: Diagnosis not present

## 2019-11-06 DIAGNOSIS — I10 Essential (primary) hypertension: Secondary | ICD-10-CM | POA: Diagnosis not present

## 2019-11-14 DIAGNOSIS — R3915 Urgency of urination: Secondary | ICD-10-CM | POA: Diagnosis not present

## 2019-11-29 ENCOUNTER — Other Ambulatory Visit: Payer: Self-pay | Admitting: Cardiology

## 2019-12-27 DIAGNOSIS — I10 Essential (primary) hypertension: Secondary | ICD-10-CM | POA: Diagnosis not present

## 2019-12-27 DIAGNOSIS — E7800 Pure hypercholesterolemia, unspecified: Secondary | ICD-10-CM | POA: Diagnosis not present

## 2019-12-27 DIAGNOSIS — I4891 Unspecified atrial fibrillation: Secondary | ICD-10-CM | POA: Diagnosis not present

## 2020-01-10 DIAGNOSIS — L298 Other pruritus: Secondary | ICD-10-CM | POA: Diagnosis not present

## 2020-01-10 DIAGNOSIS — L988 Other specified disorders of the skin and subcutaneous tissue: Secondary | ICD-10-CM | POA: Diagnosis not present

## 2020-02-08 DIAGNOSIS — L988 Other specified disorders of the skin and subcutaneous tissue: Secondary | ICD-10-CM | POA: Diagnosis not present

## 2020-02-19 DIAGNOSIS — Z20822 Contact with and (suspected) exposure to covid-19: Secondary | ICD-10-CM | POA: Diagnosis not present

## 2020-03-18 DIAGNOSIS — I10 Essential (primary) hypertension: Secondary | ICD-10-CM | POA: Diagnosis not present

## 2020-03-18 DIAGNOSIS — I4891 Unspecified atrial fibrillation: Secondary | ICD-10-CM | POA: Diagnosis not present

## 2020-03-18 DIAGNOSIS — E7800 Pure hypercholesterolemia, unspecified: Secondary | ICD-10-CM | POA: Diagnosis not present

## 2020-05-01 DIAGNOSIS — I4891 Unspecified atrial fibrillation: Secondary | ICD-10-CM | POA: Diagnosis not present

## 2020-05-01 DIAGNOSIS — I10 Essential (primary) hypertension: Secondary | ICD-10-CM | POA: Diagnosis not present

## 2020-05-01 DIAGNOSIS — E7800 Pure hypercholesterolemia, unspecified: Secondary | ICD-10-CM | POA: Diagnosis not present

## 2020-05-08 ENCOUNTER — Other Ambulatory Visit: Payer: Self-pay | Admitting: Cardiology

## 2020-05-08 NOTE — Telephone Encounter (Signed)
Please review for refill, Thanks !  

## 2020-06-25 DIAGNOSIS — I4891 Unspecified atrial fibrillation: Secondary | ICD-10-CM | POA: Diagnosis not present

## 2020-06-25 DIAGNOSIS — E7800 Pure hypercholesterolemia, unspecified: Secondary | ICD-10-CM | POA: Diagnosis not present

## 2020-06-25 DIAGNOSIS — I10 Essential (primary) hypertension: Secondary | ICD-10-CM | POA: Diagnosis not present

## 2020-08-05 DIAGNOSIS — L988 Other specified disorders of the skin and subcutaneous tissue: Secondary | ICD-10-CM | POA: Diagnosis not present

## 2020-08-05 DIAGNOSIS — L578 Other skin changes due to chronic exposure to nonionizing radiation: Secondary | ICD-10-CM | POA: Diagnosis not present

## 2020-08-05 DIAGNOSIS — W908XXS Exposure to other nonionizing radiation, sequela: Secondary | ICD-10-CM | POA: Diagnosis not present

## 2020-08-05 DIAGNOSIS — L570 Actinic keratosis: Secondary | ICD-10-CM | POA: Diagnosis not present

## 2020-09-02 DIAGNOSIS — L570 Actinic keratosis: Secondary | ICD-10-CM | POA: Diagnosis not present

## 2020-09-02 DIAGNOSIS — L308 Other specified dermatitis: Secondary | ICD-10-CM | POA: Diagnosis not present

## 2020-09-02 DIAGNOSIS — D485 Neoplasm of uncertain behavior of skin: Secondary | ICD-10-CM | POA: Diagnosis not present

## 2020-09-04 DIAGNOSIS — E7800 Pure hypercholesterolemia, unspecified: Secondary | ICD-10-CM | POA: Diagnosis not present

## 2020-09-04 DIAGNOSIS — I4891 Unspecified atrial fibrillation: Secondary | ICD-10-CM | POA: Diagnosis not present

## 2020-09-04 DIAGNOSIS — I10 Essential (primary) hypertension: Secondary | ICD-10-CM | POA: Diagnosis not present

## 2020-09-25 DIAGNOSIS — I10 Essential (primary) hypertension: Secondary | ICD-10-CM | POA: Diagnosis not present

## 2020-09-25 DIAGNOSIS — Z0000 Encounter for general adult medical examination without abnormal findings: Secondary | ICD-10-CM | POA: Diagnosis not present

## 2020-09-25 DIAGNOSIS — E7800 Pure hypercholesterolemia, unspecified: Secondary | ICD-10-CM | POA: Diagnosis not present

## 2020-09-25 DIAGNOSIS — R059 Cough, unspecified: Secondary | ICD-10-CM | POA: Diagnosis not present

## 2020-09-25 DIAGNOSIS — M109 Gout, unspecified: Secondary | ICD-10-CM | POA: Diagnosis not present

## 2020-10-02 DIAGNOSIS — I10 Essential (primary) hypertension: Secondary | ICD-10-CM | POA: Diagnosis not present

## 2020-10-02 DIAGNOSIS — E7800 Pure hypercholesterolemia, unspecified: Secondary | ICD-10-CM | POA: Diagnosis not present

## 2020-10-02 DIAGNOSIS — Z0000 Encounter for general adult medical examination without abnormal findings: Secondary | ICD-10-CM | POA: Diagnosis not present

## 2020-10-02 DIAGNOSIS — I4891 Unspecified atrial fibrillation: Secondary | ICD-10-CM | POA: Diagnosis not present

## 2020-10-16 DIAGNOSIS — L988 Other specified disorders of the skin and subcutaneous tissue: Secondary | ICD-10-CM | POA: Diagnosis not present

## 2020-11-05 DIAGNOSIS — I4891 Unspecified atrial fibrillation: Secondary | ICD-10-CM | POA: Diagnosis not present

## 2020-11-05 DIAGNOSIS — I10 Essential (primary) hypertension: Secondary | ICD-10-CM | POA: Diagnosis not present

## 2020-11-05 DIAGNOSIS — E7800 Pure hypercholesterolemia, unspecified: Secondary | ICD-10-CM | POA: Diagnosis not present

## 2020-11-17 ENCOUNTER — Other Ambulatory Visit: Payer: Self-pay | Admitting: Cardiology

## 2020-11-18 ENCOUNTER — Other Ambulatory Visit: Payer: Self-pay | Admitting: Cardiology

## 2020-11-18 NOTE — Telephone Encounter (Signed)
Prescription refill request for Xarelto received.  Indication:AFIB Last office visit:HOCHREIN 04/10/19 OVERDUE [id number] (02/25/18) Age:69M Scr:1.050 mg/ 09/25/2020 CrCl:74.7  WILL SEND IN 1 MONTH AND CONTACT SCHEDULING TO GET HIM AN APPT

## 2020-11-18 NOTE — Telephone Encounter (Signed)
Prescription refill request for Xarelto received.  Indication:atrial fib Last office visit:needs OV Weight:95.7 kg Age:80 Scr:1.0 CrCl:78.42 ml/min  Prescription refilled

## 2020-11-19 ENCOUNTER — Telehealth: Payer: Self-pay | Admitting: Cardiology

## 2020-11-19 MED ORDER — RIVAROXABAN 20 MG PO TABS: ORAL_TABLET | ORAL | 0 refills | Status: DC

## 2020-11-19 NOTE — Telephone Encounter (Signed)
*  STAT* If patient is at the pharmacy, call can be transferred to refill team.   1. Which medications need to be refilled? (please list name of each medication and dose if known) rivaroxaban (XARELTO) 20 MG TABS tablet  2. Which pharmacy/location (including street and city if local pharmacy) is medication to be sent to? [facility] IT:[id number] - [place], [place] - [address] AT [address]. Do they need a 30 day or 90 day supply?  90 day supply     Pt has an upcoming appt scheduled with Dr. Percival Spanish

## 2020-11-19 NOTE — Telephone Encounter (Signed)
Prescription refill request for Xarelto received. Indication:AFIB Last office visit:HOCHREIN 04/10/19 OVERDUE [id number] (02/25/18) Age:69M Scr:1.050 mg/ 09/25/2020 CrCl:74.7 WILL SEND IN 1ENOUGH TO GET HIM TO APPT

## 2020-11-28 DIAGNOSIS — D044 Carcinoma in situ of skin of scalp and neck: Secondary | ICD-10-CM | POA: Diagnosis not present

## 2020-11-28 DIAGNOSIS — D485 Neoplasm of uncertain behavior of skin: Secondary | ICD-10-CM | POA: Diagnosis not present

## 2020-12-03 DIAGNOSIS — R3915 Urgency of urination: Secondary | ICD-10-CM | POA: Diagnosis not present

## 2020-12-03 DIAGNOSIS — R3912 Poor urinary stream: Secondary | ICD-10-CM | POA: Diagnosis not present

## 2020-12-27 DIAGNOSIS — I4891 Unspecified atrial fibrillation: Secondary | ICD-10-CM | POA: Diagnosis not present

## 2020-12-27 DIAGNOSIS — I10 Essential (primary) hypertension: Secondary | ICD-10-CM | POA: Diagnosis not present

## 2020-12-27 DIAGNOSIS — E7800 Pure hypercholesterolemia, unspecified: Secondary | ICD-10-CM | POA: Diagnosis not present

## 2021-01-28 DIAGNOSIS — L570 Actinic keratosis: Secondary | ICD-10-CM | POA: Diagnosis not present

## 2021-01-28 DIAGNOSIS — D044 Carcinoma in situ of skin of scalp and neck: Secondary | ICD-10-CM | POA: Diagnosis not present

## 2021-01-28 DIAGNOSIS — Z85828 Personal history of other malignant neoplasm of skin: Secondary | ICD-10-CM | POA: Diagnosis not present

## 2021-02-11 DIAGNOSIS — D044 Carcinoma in situ of skin of scalp and neck: Secondary | ICD-10-CM | POA: Diagnosis not present

## 2021-02-13 DIAGNOSIS — E785 Hyperlipidemia, unspecified: Secondary | ICD-10-CM | POA: Insufficient documentation

## 2021-02-13 NOTE — Progress Notes (Signed)
Cardiology Office Note   Date:  02/14/2021   ID:  Cray, Monnin [birth year], MRN [id number]  PCP:  [name], MD  Cardiologist:   Minus Breeding, MD   Chief Complaint  Patient presents with   Atrial Fibrillation      History of Present Illness: Wayne Howell is a 80 y.o. male who presents for evaluation of  atrial fibrillation. He is status post cardioversion in the past.  However, he had recurrent atrial fib.  He does not feel his fibrillation.  He tolerates his anticoagulation.  He checks his heart rate periodically.  The patient denies any new symptoms such as chest discomfort, neck or arm discomfort. There has been no new shortness of breath, PND or orthopnea. There have been no reported palpitations, presyncope or syncope.  He ambulates slowly with a cane being inhibited by some back pain.   Past Medical History:  Diagnosis Date   Atrial fibrillation ([place])    C2 cervical fracture (HCC)    Gilbert's disease    Gout    Hyperlipidemia    Hypertension    Skin cancer     Past Surgical History:  Procedure Laterality Date   CARDIOVERSION N/A 05/16/2014   Procedure: CARDIOVERSION;  Surgeon: [name], MD;  Location: [facility];  Service: Cardiovascular;  Laterality: N/A;   CARDIOVERSION N/A 03/05/2016   Procedure: CARDIOVERSION;  Surgeon: [name], MD;  Location: [facility] ENDOSCOPY;  Service: Cardiovascular;  Laterality: N/A;   CERVICAL DISC ARTHROPLASTY     x 2   MOHS SURGERY     PROSTATE BIOPSY     TONSILLECTOMY       Current Outpatient Medications  Medication Sig Dispense Refill   acetaminophen (TYLENOL) 500 MG tablet Take 500-1,000 mg by mouth every 6 (six) hours as needed for moderate pain.     colchicine 0.6 MG tablet Take 0.6 mg by mouth as needed. gout  0   CRESTOR 5 MG tablet Take 5 mg by mouth daily.      lisinopril (PRINIVIL,ZESTRIL) 20 MG tablet Take 20 mg by mouth daily.     rivaroxaban (XARELTO) 20 MG TABS tablet TAKE 1  TABLET(20 MG) BY MOUTH DAILY. NEED FOLLOW UP 90 tablet 0   tamsulosin (FLOMAX) 0.4 MG CAPS capsule Take 0.4 mg by mouth daily.      No current facility-administered medications for this visit.    Allergies:   Codeine    ROS:  Please see the history of present illness.   Otherwise, review of systems are positive for back pain.   All other systems are reviewed and negative.    PHYSICAL EXAM: VS:  BP 117/71   Pulse 92   Ht 5\' 10"  (1.778 m)   Wt 214 lb 9.6 oz (97.3 kg)   SpO2 97%   BMI 30.79 kg/m  , BMI Body mass index is 30.79 kg/m. GENERAL:  Well appearing NECK:  No jugular venous distention, waveform within normal limits, carotid upstroke brisk and symmetric, no bruits, no thyromegaly LUNGS:  Clear to auscultation bilaterally CHEST:  Unremarkable HEART:  PMI not displaced or sustained,S1 and S2 within normal limits, no S3, no clicks, no rubs, no murmurs, irregular  ABD:  Flat, positive bowel sounds normal in frequency in pitch, no bruits, no rebound, no guarding, no midline pulsatile mass, no hepatomegaly, no splenomegaly EXT:  2 plus pulses throughout, no edema, no cyanosis no clubbing   EKG:  EKG is ordered today. The  ekg ordered today demonstrates atrial fibrillation, rate 92, axis within normal limits, intervals within normal limits, no acute ST-T wave changes.   Recent Labs: No results found for requested labs within last 8760 hours.    Lipid Panel No results found for: CHOL, TRIG, HDL, CHOLHDL, VLDL, LDLCALC, LDLDIRECT    Wt Readings from Last 3 Encounters:  02/14/21 214 lb 9.6 oz (97.3 kg)  02/25/18 211 lb (95.7 kg)  02/15/17 217 lb 12.8 oz (98.8 kg)      Other studies Reviewed: Additional studies/ records that were reviewed today include: Labs. Review of the above records demonstrates:  Please see elsewhere in the note.     ASSESSMENT AND PLAN:  ATRIAL FIB: He tolerates this.  He had some excellent questions about watchman but he has no contraindication  anticoagulation so I will not change medications.  He has good rate control.  He says he had a recent CBC although I do not have these results.  He says it was normal.  I do see his recent creatinine and he is on the appropriate dose of medicine.  HTN: His blood pressure is well controlled.  No change in therapy.  DYSLIPIDEMIA: LDL was 63 with an HDL of 53.  No change in therapy.   Current medicines are reviewed at length with the patient today.  The patient does not have concerns regarding medicines.  The following changes have been made:  no change  Labs/ tests ordered today include: None  Orders Placed This Encounter  Procedures   EKG 12-Lead      Disposition:   FU with me in 1 year   Signed, Minus Breeding, MD  02/14/2021 12:28 PM    [place]

## 2021-02-14 ENCOUNTER — Encounter: Payer: Self-pay | Admitting: Cardiology

## 2021-02-14 ENCOUNTER — Other Ambulatory Visit: Payer: Self-pay

## 2021-02-14 ENCOUNTER — Ambulatory Visit: Payer: Medicare Other | Admitting: Cardiology

## 2021-02-14 VITALS — BP 117/71 | HR 92 | Ht 70.0 in | Wt 214.6 lb

## 2021-02-14 DIAGNOSIS — I10 Essential (primary) hypertension: Secondary | ICD-10-CM

## 2021-02-14 DIAGNOSIS — I482 Chronic atrial fibrillation, unspecified: Secondary | ICD-10-CM | POA: Diagnosis not present

## 2021-02-14 DIAGNOSIS — E785 Hyperlipidemia, unspecified: Secondary | ICD-10-CM | POA: Diagnosis not present

## 2021-02-14 NOTE — Patient Instructions (Signed)
Medication Instructions:  Your physician recommends that you continue on your current medications as directed. Please refer to the Current Medication list given to you today.   *If you need a refill on your cardiac medications before your next appointment, please call your pharmacy*  Lab Work: none  Testing/Procedures: none  Follow-Up: At [facility], you and your health needs are our priority.  As part of our continuing mission to provide you with exceptional heart care, we have created designated Provider Care Teams.  These Care Teams include your primary Cardiologist (physician) and Advanced Practice Providers (APPs -  Physician Assistants and Nurse Practitioners) who all work together to provide you with the care you need, when you need it.  We recommend signing up for the patient portal called "MyChart".  Sign up information is provided on this After Visit Summary.  MyChart is used to connect with patients for Virtual Visits (Telemedicine).  Patients are able to view lab/test results, encounter notes, upcoming appointments, etc.  Non-urgent messages can be sent to your provider as well.   To learn more about what you can do with MyChart, go to [email or web address].    Your next appointment:   12 month(s)  The format for your next appointment:   In Person  Provider:   You may see Minus Breeding, MD or one of the following Advanced Practice Providers on your designated Care Team:   Rosaria Ferries, PA-C Caron Presume, PA-C Jory Sims, DNP, ANP

## 2021-02-17 ENCOUNTER — Other Ambulatory Visit: Payer: Self-pay | Admitting: Cardiology

## 2021-02-17 NOTE — Telephone Encounter (Signed)
Prescription refill request for Xarelto received.  Indication: afib  Last office visit: Hochrein, 02/14/2021 Weight: 97.3 kg  Age: 80 yo  Scr: 1.05, 09/25/2020 CrCl: 75 ml/min    Refill sent.

## 2021-03-06 DIAGNOSIS — L821 Other seborrheic keratosis: Secondary | ICD-10-CM | POA: Diagnosis not present

## 2021-03-06 DIAGNOSIS — I4891 Unspecified atrial fibrillation: Secondary | ICD-10-CM | POA: Diagnosis not present

## 2021-03-06 DIAGNOSIS — L578 Other skin changes due to chronic exposure to nonionizing radiation: Secondary | ICD-10-CM | POA: Diagnosis not present

## 2021-03-06 DIAGNOSIS — I10 Essential (primary) hypertension: Secondary | ICD-10-CM | POA: Diagnosis not present

## 2021-03-06 DIAGNOSIS — W908XXS Exposure to other nonionizing radiation, sequela: Secondary | ICD-10-CM | POA: Diagnosis not present

## 2021-03-06 DIAGNOSIS — L570 Actinic keratosis: Secondary | ICD-10-CM | POA: Diagnosis not present

## 2021-03-06 DIAGNOSIS — E7800 Pure hypercholesterolemia, unspecified: Secondary | ICD-10-CM | POA: Diagnosis not present

## 2021-03-25 DIAGNOSIS — L570 Actinic keratosis: Secondary | ICD-10-CM | POA: Diagnosis not present

## 2021-03-25 DIAGNOSIS — Z86008 Personal history of in-situ neoplasm of other site: Secondary | ICD-10-CM | POA: Diagnosis not present

## 2021-04-03 DIAGNOSIS — Z961 Presence of intraocular lens: Secondary | ICD-10-CM | POA: Diagnosis not present

## 2021-04-03 DIAGNOSIS — H2512 Age-related nuclear cataract, left eye: Secondary | ICD-10-CM | POA: Diagnosis not present

## 2021-04-03 DIAGNOSIS — H43813 Vitreous degeneration, bilateral: Secondary | ICD-10-CM | POA: Diagnosis not present

## 2021-04-03 DIAGNOSIS — H353131 Nonexudative age-related macular degeneration, bilateral, early dry stage: Secondary | ICD-10-CM | POA: Diagnosis not present

## 2021-04-25 ENCOUNTER — Telehealth: Payer: Self-pay | Admitting: Cardiology

## 2021-04-25 NOTE — Telephone Encounter (Signed)
Pt c/o medication issue:  1. Name of Medication: Gabapentin 100 MG  2. How are you currently taking this medication (dosage and times per day)? Not currently taking   3. Are you having a reaction (difficulty breathing--STAT)? No   4. What is your medication issue? Wayne Howell is calling wanting to know if he can take Gabapentin twice a day for his lower back pain.

## 2021-04-29 NOTE — Telephone Encounter (Signed)
No, should not be any issues

## 2021-04-29 NOTE — Telephone Encounter (Signed)
Returned call to pt, turns out that pt was not asking if the increase of gabapentin 100mg  BID was ok. He was just making sure the increased dose would interact with his AFIB in any way?

## 2021-04-30 NOTE — Telephone Encounter (Signed)
Left detailed message as noted for patient (DPR), call back w/any questions  

## 2021-05-26 DIAGNOSIS — L905 Scar conditions and fibrosis of skin: Secondary | ICD-10-CM | POA: Diagnosis not present

## 2021-07-28 DIAGNOSIS — L988 Other specified disorders of the skin and subcutaneous tissue: Secondary | ICD-10-CM | POA: Diagnosis not present

## 2021-07-28 DIAGNOSIS — L905 Scar conditions and fibrosis of skin: Secondary | ICD-10-CM | POA: Diagnosis not present

## 2021-08-21 ENCOUNTER — Telehealth: Payer: Self-pay | Admitting: Cardiology

## 2021-08-21 NOTE — Telephone Encounter (Signed)
? ?  Patient Name: Wayne Howell  ?DOB: 1940-05-26 ?MRN: [id number] ? ?Primary Cardiologist: Minus Breeding, MD ? ?Chart reviewed as part of pre-operative protocol coverage.  ? ?IF SIMPLE EXTRACTION/CLEANINGS: Simple dental extractions (i.e. 1-2 teeth) are considered low risk procedures per guidelines and generally do not require any specific cardiac clearance. It is also generally accepted that for simple extractions and dental cleanings, there is no need to interrupt blood thinner therapy. ? ?SBE prophylaxis is not required for the patient from a cardiac standpoint. ? ?I will route this recommendation to the requesting party via Epic fax function and remove from pre-op pool. ? ?Please call with questions. ? ?Christell Faith, PA-C ?08/21/2021, 3:07 PM ? ?

## 2021-08-21 NOTE — Telephone Encounter (Signed)
? ?  Pre-operative Risk Assessment  ?  ?Patient Name: Wayne Howell  ?DOB: [birth year] ?MRN: [id number]  ? ? ? ?Request for Surgical Clearance   ? ?Procedure:   ? ?Date of Surgery:  Clearance  Pending                              ?   ?Surgeon:  Dr Shanon Brow McLeod,DDS ?Surgeon's Group or Practice Name:   ?Phone number:  [phone] ?Fax number:  [phone] ?  ?Type of Clearance Requested:   Medicine - Xarelto ?  ?Type of Anesthesia:  Local  ?  ?Additional requests/questions:   ? ?Signed, ?Glyn Ade   ?08/21/2021, 12:56 PM  ? ?

## 2021-08-21 NOTE — Telephone Encounter (Signed)
Dr. Barrie Dunker, DDS personally s/w me and clarified the procedure will be for 1 tooth to be extracted , simple extraction. I thanked Dr. Barrie Dunker for his help in this matter. I assured Dr. Barrie Dunker I will update our pre op provider so that we may complete the clearance.  ?

## 2021-08-21 NOTE — Telephone Encounter (Signed)
Please get details, there is no procedure documented.  ?

## 2021-08-22 ENCOUNTER — Other Ambulatory Visit: Payer: Self-pay | Admitting: Cardiology

## 2021-08-22 NOTE — Telephone Encounter (Signed)
Prescription refill request for Xarelto received.  ?Indication:Afib ?Last office visit:10/22 ?Weight:97.3 kg ?Age:81 ?Scr:1.0 ?CrCl:78.38 ml/min ? ?Prescription refilled ? ?

## 2021-09-04 DIAGNOSIS — L814 Other melanin hyperpigmentation: Secondary | ICD-10-CM | POA: Diagnosis not present

## 2021-09-04 DIAGNOSIS — D1801 Hemangioma of skin and subcutaneous tissue: Secondary | ICD-10-CM | POA: Diagnosis not present

## 2021-09-04 DIAGNOSIS — L570 Actinic keratosis: Secondary | ICD-10-CM | POA: Diagnosis not present

## 2021-09-04 DIAGNOSIS — X32XXXS Exposure to sunlight, sequela: Secondary | ICD-10-CM | POA: Diagnosis not present

## 2021-09-29 ENCOUNTER — Telehealth: Payer: Self-pay | Admitting: Cardiology

## 2021-09-29 NOTE — Telephone Encounter (Signed)
Called patient, advised that he was having one tooth extracted- no others.  He is on Xarelto- and would like to know if he should hold it.  I did advise of our protocol per last clearance on file from 07/2021. See below:   IF SIMPLE EXTRACTION/CLEANINGS: Simple dental extractions (i.e. 1-2 teeth) are considered low risk procedures per guidelines and generally do not require any specific cardiac clearance. It is also generally accepted that for simple extractions and dental cleanings, there is no need to interrupt blood thinner therapy.  Will route to MD to make aware.  Thanks!

## 2021-09-29 NOTE — Telephone Encounter (Signed)
Patient is on Xarelto, he is going to have a tooth extracted. He want to know how he should procedure with the Xarelto.

## 2021-10-07 DIAGNOSIS — I10 Essential (primary) hypertension: Secondary | ICD-10-CM | POA: Diagnosis not present

## 2021-10-07 DIAGNOSIS — D6869 Other thrombophilia: Secondary | ICD-10-CM | POA: Diagnosis not present

## 2021-10-07 DIAGNOSIS — M109 Gout, unspecified: Secondary | ICD-10-CM | POA: Diagnosis not present

## 2021-10-07 DIAGNOSIS — E7800 Pure hypercholesterolemia, unspecified: Secondary | ICD-10-CM | POA: Diagnosis not present

## 2021-10-15 DIAGNOSIS — Z0000 Encounter for general adult medical examination without abnormal findings: Secondary | ICD-10-CM | POA: Diagnosis not present

## 2021-10-15 DIAGNOSIS — M545 Low back pain, unspecified: Secondary | ICD-10-CM | POA: Diagnosis not present

## 2021-10-15 DIAGNOSIS — I10 Essential (primary) hypertension: Secondary | ICD-10-CM | POA: Diagnosis not present

## 2021-10-15 DIAGNOSIS — K5900 Constipation, unspecified: Secondary | ICD-10-CM | POA: Diagnosis not present

## 2021-10-29 DIAGNOSIS — H6123 Impacted cerumen, bilateral: Secondary | ICD-10-CM | POA: Diagnosis not present

## 2021-11-19 ENCOUNTER — Other Ambulatory Visit: Payer: Self-pay | Admitting: Cardiology

## 2021-11-19 NOTE — Telephone Encounter (Signed)
Prescription refill request for Xarelto received.   Indication: afib  Last office visit: Hochrein, 02/14/2021 Weight: 97.3 kg  Age: 81 yo  Scr: 0.97, 10/07/2021 CrCl: 81 ml/min   Pt is on the correct dose of Xarleto. Refill sent.

## 2021-12-02 DIAGNOSIS — R351 Nocturia: Secondary | ICD-10-CM | POA: Diagnosis not present

## 2021-12-02 DIAGNOSIS — N401 Enlarged prostate with lower urinary tract symptoms: Secondary | ICD-10-CM | POA: Diagnosis not present

## 2021-12-02 DIAGNOSIS — R3915 Urgency of urination: Secondary | ICD-10-CM | POA: Diagnosis not present

## 2021-12-09 DIAGNOSIS — S40862A Insect bite (nonvenomous) of left upper arm, initial encounter: Secondary | ICD-10-CM | POA: Diagnosis not present

## 2021-12-31 DIAGNOSIS — L810 Postinflammatory hyperpigmentation: Secondary | ICD-10-CM | POA: Diagnosis not present

## 2022-01-20 DIAGNOSIS — R351 Nocturia: Secondary | ICD-10-CM | POA: Diagnosis not present

## 2022-01-20 DIAGNOSIS — R3915 Urgency of urination: Secondary | ICD-10-CM | POA: Diagnosis not present

## 2022-01-20 DIAGNOSIS — N401 Enlarged prostate with lower urinary tract symptoms: Secondary | ICD-10-CM | POA: Diagnosis not present

## 2022-02-26 NOTE — Progress Notes (Signed)
Cardiology Office Note   Date:  02/27/2022   ID:  NICKO DAHER, DOB Jun 20, 1940, MRN [id number]  PCP:  [name], MD  Cardiologist:   Minus Breeding, MD   Chief Complaint  Patient presents with   Atrial Fibrillation      History of Present Illness: Wayne Howell is a 81 y.o. male who presents for evaluation of  atrial fibrillation. He is status post cardioversion in the past.  However, he had recurrent atrial fib.  He does not feel his fibrillation.    He presents for follow up.  Since I last saw him he has done well. The patient denies any new symptoms such as chest discomfort, neck or arm discomfort. There has been no new shortness of breath, PND or orthopnea. There have been no reported palpitations, presyncope or syncope.  He said he did have his heart rate being in the 60s once that was unusual.  She was in the 70s or 80s.  He has neck problems and gets around slowly and walks with a cane.  He has balance issues.   Past Medical History:  Diagnosis Date   Atrial fibrillation ([place])    C2 cervical fracture (HCC)    Gilbert's disease    Gout    Hyperlipidemia    Hypertension    Skin cancer     Past Surgical History:  Procedure Laterality Date   CARDIOVERSION N/A 05/16/2014   Procedure: CARDIOVERSION;  Surgeon: [name], MD;  Location: [facility];  Service: Cardiovascular;  Laterality: N/A;   CARDIOVERSION N/A 03/05/2016   Procedure: CARDIOVERSION;  Surgeon: [name], MD;  Location[facility] END[facility]COPY;  Service: Cardiovascular;  Laterality: N/A;   CERVICAL DISC ARTHROPLASTY     x 2   MOHS SURGERY     PROSTATE BIOPSY     TONSILLECTOMY       Current Outpatient Medications  Medication Sig Dispense Refill   acetaminophen (TYLENOL) 500 MG tablet Take 500-1,000 mg by mouth every 6 (six) hours as needed for moderate pain.     colchicine 0.6 MG tablet Take 0.6 mg by mouth as needed. gout  0   CRESTOR 5 MG tablet Take 5 mg by mouth daily.       lisinopril (PRINIVIL,ZESTRIL) 20 MG tablet Take 20 mg by mouth daily.     MYRBETRIQ 25 MG TB24 tablet Take 25 mg by mouth daily.     rivaroxaban (XARELTO) 20 MG TABS tablet TAKE 1 TABLET(20 MG) BY MOUTH DAILY WITH SUPPER 90 tablet 1   tamsulosin (FLOMAX) 0.4 MG CAPS capsule Take 0.4 mg by mouth daily.      No current facility-administered medications for this visit.    Allergies:   Codeine    ROS:  Please see the history of present illness.   Otherwise, review of systems are positive for back pain.   All other systems are reviewed and negative.    PHYSICAL EXAM: VS:  BP 120/74   Pulse 84   Ht '5\' 10"'$  (1.778 m)   Wt 209 lb 9.6 oz (95.1 kg)   SpO2 97%   BMI 30.07 kg/m  , BMI Body mass index is 30.07 kg/m. GENERAL:  Well appearing NECK:  No jugular venous distention, waveform within normal limits, carotid upstroke brisk and symmetric, no bruits, no thyromegaly LUNGS:  Clear to auscultation bilaterally CHEST:  Unremarkable HEART:  PMI not displaced or sustained,S1 and S2 within normal limits, no S3, no clicks,  no rubs, no murmurs, irregular  ABD:  Flat, positive bowel sounds normal in frequency in pitch, no bruits, no rebound, no guarding, no midline pulsatile mass, no hepatomegaly, no splenomegaly EXT:  2 plus pulses throughout, right leg greater than left leg edema, no cyanosis no clubbing   EKG:  EKG is  ordered today. The ekg ordered today demonstrates atrial fibrillation, rate 84, axis within normal limits, intervals within normal limits, no acute ST-T wave changes.   Recent Labs: No results found for requested labs within last 365 days.    Lipid Panel No results found for: "CHOL", "TRIG", "HDL", "CHOLHDL", "VLDL", "LDLCALC", "LDLDIRECT"    Wt Readings from Last 3 Encounters:  02/27/22 209 lb 9.6 oz (95.1 kg)  02/14/21 214 lb 9.6 oz (97.3 kg)  02/25/18 211 lb (95.7 kg)      Other studies Reviewed: Additional studies/ records that were reviewed today include:  Labs. Review of the above records demonstrates:  Please see elsewhere in the note.     ASSESSMENT AND PLAN:  ATRIAL FIB: He tolerates this.  The patient tolerates this and has good rate control.  He is not having any trouble with anticoagulation.  We have briefly talked in the past about Watchman when he brought it up.  However, I think he is doing well and has no significant contraindications to the anticoagulation so he will continue it.   HTN: His blood pressure is at target.  No change in therapy.   DYSLIPIDEMIA: LDL was 65.  No change in therapy.    Current medicines are reviewed at length with the patient today.  The patient does not have concerns regarding medicines.  The following changes have been made: None  Labs/ tests ordered today include:   None  Orders Placed This Encounter  Procedures   EKG 12-Lead    Disposition:   FU with me in 12 months   Signed, Minus Breeding, MD  02/27/2022 4:46 PM    [place]

## 2022-02-27 ENCOUNTER — Encounter: Payer: Self-pay | Admitting: Cardiology

## 2022-02-27 ENCOUNTER — Ambulatory Visit: Payer: Medicare Other | Attending: Cardiology | Admitting: Cardiology

## 2022-02-27 VITALS — BP 120/74 | HR 84 | Ht 70.0 in | Wt 209.6 lb

## 2022-02-27 DIAGNOSIS — I10 Essential (primary) hypertension: Secondary | ICD-10-CM

## 2022-02-27 DIAGNOSIS — I482 Chronic atrial fibrillation, unspecified: Secondary | ICD-10-CM

## 2022-02-27 DIAGNOSIS — E785 Hyperlipidemia, unspecified: Secondary | ICD-10-CM | POA: Diagnosis not present

## 2022-02-27 NOTE — Patient Instructions (Signed)
Medication Instructions:  No changes *If you need a refill on your cardiac medications before your next appointment, please call your pharmacy*   Lab Work: None ordered If you have labs (blood work) drawn today and your tests are completely normal, you will receive your results only by: MyChart Message (if you have MyChart) OR A paper copy in the mail If you have any lab test that is abnormal or we need to change your treatment, we will call you to review the results.   Testing/Procedures: None ordered   Follow-Up: At [facility], you and your health needs are our priority.  As part of our continuing mission to provide you with exceptional heart care, we have created designated Provider Care Teams.  These Care Teams include your primary Cardiologist (physician) and Advanced Practice Providers (APPs -  Physician Assistants and Nurse Practitioners) who all work together to provide you with the care you need, when you need it.  We recommend signing up for the patient portal called "MyChart".  Sign up information is provided on this After Visit Summary.  MyChart is used to connect with patients for Virtual Visits (Telemedicine).  Patients are able to view lab/test results, encounter notes, upcoming appointments, etc.  Non-urgent messages can be sent to your provider as well.   To learn more about what you can do with MyChart, go to [email or web address].    Your next appointment:   12 month(s)  The format for your next appointment:   In Person  Provider:   James Hochrein, MD      Important Information About Sugar       

## 2022-04-03 DIAGNOSIS — Z961 Presence of intraocular lens: Secondary | ICD-10-CM | POA: Diagnosis not present

## 2022-04-03 DIAGNOSIS — H353131 Nonexudative age-related macular degeneration, bilateral, early dry stage: Secondary | ICD-10-CM | POA: Diagnosis not present

## 2022-04-03 DIAGNOSIS — H2512 Age-related nuclear cataract, left eye: Secondary | ICD-10-CM | POA: Diagnosis not present

## 2022-04-03 DIAGNOSIS — H43813 Vitreous degeneration, bilateral: Secondary | ICD-10-CM | POA: Diagnosis not present

## 2022-04-29 DIAGNOSIS — K080 Exfoliation of teeth due to systemic causes: Secondary | ICD-10-CM | POA: Diagnosis not present

## 2022-05-22 ENCOUNTER — Telehealth: Payer: Self-pay | Admitting: Cardiology

## 2022-05-22 NOTE — Telephone Encounter (Signed)
Patient with diagnosis of afib on Xarelto for anticoagulation.    Procedure: implant Date of procedure: 06/01/22  CHA2DS2-VASc Score = 3  This indicates a 3.2% annual risk of stroke. The patient's score is based upon: CHF History: 0 HTN History: 1 Diabetes History: 0 Stroke History: 0 Vascular Disease History: 0 Age Score: 2 Gender Score: 0   CrCl 43m/min Platelet count - not checked since 2019  Patient does not require pre-op antibiotics for dental procedure.  Per office protocol, patient can hold Xarelto the evening before his implant.    **This guidance is not considered finalized until pre-operative APP has relayed final recommendations.**

## 2022-05-22 NOTE — Telephone Encounter (Signed)
   Patient Name: Wayne Howell  DOB: 08/07/1940 MRN: [id number]  Primary Cardiologist: Minus Breeding, MD  Chart reviewed as part of pre-operative protocol coverage. Pre-op clearance already addressed by colleagues in earlier phone notes. To summarize recommendations:  -Patient does not require pre-op antibiotics for dental procedure.   Per office protocol, patient can hold Xarelto the evening before his implant.  Will route this bundled recommendation to requesting provider via Epic fax function and remove from pre-op pool. Please call with questions.  Elgie Collard, PA-C 05/22/2022, 4:35 PM

## 2022-05-22 NOTE — Telephone Encounter (Signed)
   Pre-operative Risk Assessment    Patient Name: Wayne Howell  DOB: January 12, 1941 MRN: [id number]     Request for Surgical Clearance    Procedure:   Implant  Date of Surgery:  Clearance 06/01/22                                 Surgeon:  Dr. Hardie Shackleton   Surgeon's Group or Practice Name:  [facility] Hardie Shackleton [facility] Phone number:  [phone] Fax number:  [phone]   Type of Clearance Requested:   - Pharmacy:  Hold Rivaroxaban (Xarelto) Hold night Prior   Type of Anesthesia:  Local    Additional requests/questions:  Please advise surgeon/provider what medications should be held.  Signed, Belisicia T Harris   05/22/2022, 9:58 AM

## 2022-06-02 DIAGNOSIS — L820 Inflamed seborrheic keratosis: Secondary | ICD-10-CM | POA: Diagnosis not present

## 2022-06-02 DIAGNOSIS — D485 Neoplasm of uncertain behavior of skin: Secondary | ICD-10-CM | POA: Diagnosis not present

## 2022-06-02 DIAGNOSIS — L570 Actinic keratosis: Secondary | ICD-10-CM | POA: Diagnosis not present

## 2022-06-02 DIAGNOSIS — L718 Other rosacea: Secondary | ICD-10-CM | POA: Diagnosis not present

## 2022-06-02 DIAGNOSIS — C44319 Basal cell carcinoma of skin of other parts of face: Secondary | ICD-10-CM | POA: Diagnosis not present

## 2022-06-02 DIAGNOSIS — L821 Other seborrheic keratosis: Secondary | ICD-10-CM | POA: Diagnosis not present

## 2022-07-06 DIAGNOSIS — L249 Irritant contact dermatitis, unspecified cause: Secondary | ICD-10-CM | POA: Diagnosis not present

## 2022-07-07 DIAGNOSIS — C44319 Basal cell carcinoma of skin of other parts of face: Secondary | ICD-10-CM | POA: Diagnosis not present

## 2022-07-14 DIAGNOSIS — L905 Scar conditions and fibrosis of skin: Secondary | ICD-10-CM | POA: Diagnosis not present

## 2022-08-25 ENCOUNTER — Other Ambulatory Visit: Payer: Self-pay

## 2022-08-25 DIAGNOSIS — I482 Chronic atrial fibrillation, unspecified: Secondary | ICD-10-CM

## 2022-08-25 MED ORDER — RIVAROXABAN 20 MG PO TABS: ORAL_TABLET | ORAL | 1 refills | Status: DC

## 2022-08-25 NOTE — Telephone Encounter (Signed)
Prescription refill request for Xarelto received.  Indication: Afib  Last office visit: 02/27/22 (Hochrein)  Weight: 95.1kg Age: 82 Scr: 0.97 (10/07/21 via [facility])  CrCl: 77.14ml/min  Appropriate dose. Refill sent.

## 2022-11-09 DIAGNOSIS — Z1389 Encounter for screening for other disorder: Secondary | ICD-10-CM | POA: Diagnosis not present

## 2022-11-09 DIAGNOSIS — Z0000 Encounter for general adult medical examination without abnormal findings: Secondary | ICD-10-CM | POA: Diagnosis not present

## 2022-11-09 DIAGNOSIS — E669 Obesity, unspecified: Secondary | ICD-10-CM | POA: Diagnosis not present

## 2022-11-16 DIAGNOSIS — D6869 Other thrombophilia: Secondary | ICD-10-CM | POA: Diagnosis not present

## 2022-11-16 DIAGNOSIS — Z0000 Encounter for general adult medical examination without abnormal findings: Secondary | ICD-10-CM | POA: Diagnosis not present

## 2022-11-16 DIAGNOSIS — I4891 Unspecified atrial fibrillation: Secondary | ICD-10-CM | POA: Diagnosis not present

## 2022-11-16 DIAGNOSIS — E7800 Pure hypercholesterolemia, unspecified: Secondary | ICD-10-CM | POA: Diagnosis not present

## 2022-11-16 DIAGNOSIS — M109 Gout, unspecified: Secondary | ICD-10-CM | POA: Diagnosis not present

## 2022-11-16 DIAGNOSIS — I10 Essential (primary) hypertension: Secondary | ICD-10-CM | POA: Diagnosis not present

## 2022-11-16 DIAGNOSIS — D696 Thrombocytopenia, unspecified: Secondary | ICD-10-CM | POA: Diagnosis not present

## 2022-11-18 ENCOUNTER — Ambulatory Visit (INDEPENDENT_AMBULATORY_CARE_PROVIDER_SITE_OTHER): Payer: Medicare Other | Admitting: Urology

## 2022-11-18 ENCOUNTER — Encounter: Payer: Self-pay | Admitting: Urology

## 2022-11-18 VITALS — BP 148/70 | HR 70 | Ht 70.0 in | Wt 206.0 lb

## 2022-11-18 DIAGNOSIS — N401 Enlarged prostate with lower urinary tract symptoms: Secondary | ICD-10-CM

## 2022-11-18 DIAGNOSIS — Z86008 Personal history of in-situ neoplasm of other site: Secondary | ICD-10-CM | POA: Diagnosis not present

## 2022-11-18 DIAGNOSIS — L729 Follicular cyst of the skin and subcutaneous tissue, unspecified: Secondary | ICD-10-CM | POA: Diagnosis not present

## 2022-11-18 DIAGNOSIS — L570 Actinic keratosis: Secondary | ICD-10-CM | POA: Diagnosis not present

## 2022-11-18 DIAGNOSIS — N138 Other obstructive and reflux uropathy: Secondary | ICD-10-CM

## 2022-11-18 DIAGNOSIS — D0462 Carcinoma in situ of skin of left upper limb, including shoulder: Secondary | ICD-10-CM | POA: Diagnosis not present

## 2022-11-18 DIAGNOSIS — N3942 Incontinence without sensory awareness: Secondary | ICD-10-CM | POA: Diagnosis not present

## 2022-11-18 DIAGNOSIS — Z85828 Personal history of other malignant neoplasm of skin: Secondary | ICD-10-CM | POA: Diagnosis not present

## 2022-11-18 DIAGNOSIS — L814 Other melanin hyperpigmentation: Secondary | ICD-10-CM | POA: Diagnosis not present

## 2022-11-18 DIAGNOSIS — D485 Neoplasm of uncertain behavior of skin: Secondary | ICD-10-CM | POA: Diagnosis not present

## 2022-11-18 DIAGNOSIS — L821 Other seborrheic keratosis: Secondary | ICD-10-CM | POA: Diagnosis not present

## 2022-11-18 DIAGNOSIS — C44519 Basal cell carcinoma of skin of other part of trunk: Secondary | ICD-10-CM | POA: Diagnosis not present

## 2022-11-18 LAB — URINALYSIS, ROUTINE W REFLEX MICROSCOPIC
Bilirubin, UA: NEGATIVE
Glucose, UA: NEGATIVE
Ketones, UA: NEGATIVE
Leukocytes,UA: NEGATIVE
Nitrite, UA: NEGATIVE
Protein,UA: NEGATIVE
Specific Gravity, UA: 1.01 (ref 1.005–1.030)
Urobilinogen, Ur: 0.2 mg/dL (ref 0.2–1.0)
pH, UA: 6.5 (ref 5.0–7.5)

## 2022-11-18 LAB — BLADDER SCAN AMB NON-IMAGING

## 2022-11-18 MED ORDER — GEMTESA 75 MG PO TABS: 75.0000 mg | ORAL_TABLET | Freq: Every day | ORAL | Status: DC

## 2022-11-18 MED ORDER — ALFUZOSIN HCL ER 10 MG PO TB24: 10.0000 mg | ORAL_TABLET | Freq: Every day | ORAL | 11 refills | Status: AC

## 2022-11-18 NOTE — Progress Notes (Signed)
Assessment: 1. Incontinence without sensory awareness   2. BPH with obstruction/lower urinary tract symptoms     Plan: I personally reviewed the patient's chart including provider notes from [facility]. Trial of alfuzosin 10 mg daily in place of tamsulosin. Samples of Gemtesa 75 mg provided. Return to office in 1 month.   Chief Complaint:  Chief Complaint  Patient presents with   Benign Prostatic Hypertrophy    History of Present Illness:  Wayne Howell is a 82 y.o. male who is seen for evaluation of BPH with lower urinary tract symptoms and urinary incontinence. He has been followed at [facility] initially with Dr. Isabel Caprice and most recently with Dr. Berneice Heinrich with his last visit in September 2023.  He has been on tamsulosin twice daily for a number of years for his obstructive urinary symptoms.  He previously tried Information systems manager which he did not tolerate.  He noted increased nocturia and August 2023 with some associated urgency and leakage.  He was given a trial of Myrbetriq.  He reported discontinuing the medication due to side effects of constipation.  He currently has urinary frequency, voiding every 2 hours, occasional urgency, nocturia x 3, and urinary incontinence without awareness.  He reports that he leaks throughout the day and at night.  He does wear a adult diaper daily.  He voids with a slow stream and feels like he empties his bladder completely.  No dysuria or gross hematuria.  No recent UTIs.  IPSS = 15 QOL= 5 today.  Past Medical History:  Past Medical History:  Diagnosis Date   Atrial fibrillation (HCC)    C2 cervical fracture (HCC)    Gilbert's disease    Gout    Hyperlipidemia    Hypertension    Skin cancer     Past Surgical History:  Past Surgical History:  Procedure Laterality Date   CARDIOVERSION N/A 05/16/2014   Procedure: CARDIOVERSION;  Surgeon: Wendall Stade, MD;  Location: [facility] ENDOSCOPY;  Service: Cardiovascular;  Laterality: N/A;    CARDIOVERSION N/A 03/05/2016   Procedure: CARDIOVERSION;  Surgeon: Laurey Morale, MD;  Location: [facility] ENDOSCOPY;  Service: Cardiovascular;  Laterality: N/A;   CERVICAL DISC ARTHROPLASTY     x 2   MOHS SURGERY     PROSTATE BIOPSY     TONSILLECTOMY      Allergies:  Allergies  Allergen Reactions   Codeine Nausea And Vomiting    Family History:  Family History  Problem Relation Age of Onset   Cancer Mother        Breast   Heart attack Father 1       Died age 67 of [place]    Social History:  Social History   Tobacco Use   Smoking status: Former    Types: Cigarettes   Smokeless tobacco: Never   Tobacco comments:    Quit 33 years ago.  Vaping Use   Vaping status: Never Used  Substance Use Topics   Alcohol use: Yes    Alcohol/week: 3.0 standard drinks of alcohol    Types: 3 Standard drinks or equivalent per week   Drug use: No    Review of symptoms:  Constitutional:  Negative for unexplained weight loss, night sweats, fever, chills ENT:  Negative for nose bleeds, sinus pain, painful swallowing CV:  Negative for chest pain, shortness of breath, exercise intolerance, palpitations, loss of consciousness Resp:  Negative for cough, wheezing, shortness of breath GI:  Negative for nausea, vomiting, diarrhea, bloody stools GU:  Positives noted in HPI; otherwise negative for gross hematuria, dysuria Neuro:  Negative for seizures, poor balance, limb weakness, slurred speech Psych:  Negative for lack of energy, depression, anxiety Endocrine:  Negative for polydipsia, polyuria, symptoms of hypoglycemia (dizziness, hunger, sweating) Hematologic:  Negative for anemia, purpura, petechia, prolonged or excessive bleeding, use of anticoagulants  Allergic:  Negative for difficulty breathing or choking as a result of exposure to anything; no shellfish allergy; no allergic response (rash/itch) to materials, foods  Physical exam: BP (!) 148/70   Pulse 70   Ht 5\' 10"  (1.778 m)   Wt 206 lb  (93.4 kg)   BMI 29.56 kg/m  GENERAL APPEARANCE:  Well appearing, well developed, well nourished, NAD HEENT: Atraumatic, Normocephalic, oropharynx clear. NECK: Supple without lymphadenopathy or thyromegaly. LUNGS: Clear to auscultation bilaterally. HEART: Regular Rate and Rhythm without murmurs, gallops, or rubs. ABDOMEN: Soft, non-tender, No Masses. EXTREMITIES: Moves all extremities well.  Without clubbing, cyanosis, or edema. NEUROLOGIC:  Alert and oriented x 3, normal gait, CN II-XII grossly intact.  MENTAL STATUS:  Appropriate. BACK:  Non-tender to palpation.  No CVAT SKIN:  Warm, dry and intact.   GU: Penis:  uncircumcised Meatus: Normal Scrotum: normal, no masses Testis: normal without masses bilateral Prostate: 40 g, NT, no nodules Rectum: Normal tone,  no masses or tenderness   Results: U/A:  0 RBCs, 0 WBCs  PVR:  125 ml

## 2022-11-26 ENCOUNTER — Other Ambulatory Visit: Payer: Self-pay | Admitting: Cardiology

## 2022-11-26 DIAGNOSIS — K080 Exfoliation of teeth due to systemic causes: Secondary | ICD-10-CM | POA: Diagnosis not present

## 2022-11-26 DIAGNOSIS — I482 Chronic atrial fibrillation, unspecified: Secondary | ICD-10-CM

## 2022-11-26 NOTE — Telephone Encounter (Signed)
Prescription refill request for Xarelto received.  Indication: AF Last office visit: 02/3022  Daiva Nakayama MD Weight: 95.1kg Age: 82 Scr: 0.91 on 11/16/22  KPN CrCl: 82.73  Based on above findings Xarelto 20mg  daily is the appropriate dose.  Refill approved.

## 2022-12-16 DIAGNOSIS — D0462 Carcinoma in situ of skin of left upper limb, including shoulder: Secondary | ICD-10-CM | POA: Diagnosis not present

## 2022-12-16 DIAGNOSIS — C44519 Basal cell carcinoma of skin of other part of trunk: Secondary | ICD-10-CM | POA: Diagnosis not present

## 2022-12-16 DIAGNOSIS — L570 Actinic keratosis: Secondary | ICD-10-CM | POA: Diagnosis not present

## 2022-12-17 ENCOUNTER — Encounter: Payer: Self-pay | Admitting: Urology

## 2022-12-17 ENCOUNTER — Ambulatory Visit: Payer: Medicare Other | Admitting: Urology

## 2022-12-17 VITALS — BP 124/73 | HR 69 | Ht 70.0 in | Wt 200.0 lb

## 2022-12-17 DIAGNOSIS — N401 Enlarged prostate with lower urinary tract symptoms: Secondary | ICD-10-CM

## 2022-12-17 DIAGNOSIS — N3942 Incontinence without sensory awareness: Secondary | ICD-10-CM

## 2022-12-17 DIAGNOSIS — N138 Other obstructive and reflux uropathy: Secondary | ICD-10-CM

## 2022-12-17 LAB — URINALYSIS, ROUTINE W REFLEX MICROSCOPIC
Bilirubin, UA: NEGATIVE
Glucose, UA: NEGATIVE
Ketones, UA: NEGATIVE
Leukocytes,UA: NEGATIVE
Nitrite, UA: NEGATIVE
Protein,UA: NEGATIVE
RBC, UA: NEGATIVE
Specific Gravity, UA: 1.025 (ref 1.005–1.030)
Urobilinogen, Ur: 4 mg/dL — ABNORMAL HIGH (ref 0.2–1.0)
pH, UA: 6 (ref 5.0–7.5)

## 2022-12-17 MED ORDER — GEMTESA 75 MG PO TABS
75.0000 mg | ORAL_TABLET | Freq: Every day | ORAL | 11 refills | Status: DC
Start: 2022-12-17 — End: 2023-03-15

## 2022-12-17 NOTE — Progress Notes (Signed)
Assessment: 1. BPH with obstruction/lower urinary tract symptoms   2. Incontinence without sensory awareness     Plan: Continue alfuzosin 10 mg daily and Gemtesa 75 mg daily. Return to office in 2 months  Chief Complaint:  Chief Complaint  Patient presents with   Benign Prostatic Hypertrophy    History of Present Illness:  Wayne Howell is a 82 y.o. male who is seen for continued evaluation of BPH with lower urinary tract symptoms and urinary incontinence. He has been followed at [facility] initially with Dr. Isabel Caprice and most recently with Dr. Berneice Heinrich with his last visit in September 2023.  He has been on tamsulosin twice daily for a number of years for his obstructive urinary symptoms.  He previously tried Information systems manager which he did not tolerate.  He noted increased nocturia in August 2023 with some associated urgency and leakage.  He was given a trial of Myrbetriq.  He reported discontinuing the medication due to side effects of constipation.  He currently has urinary frequency, voiding every 2 hours, occasional urgency, nocturia x 3, and urinary incontinence without awareness.  He reports that he leaks throughout the day and at night.  He does wear a adult diaper daily.  He voids with a slow stream and feels like he empties his bladder completely.  No dysuria or gross hematuria.  No recent UTIs.  IPSS = 15 QOL= 5. PVR = 125 ml.  He was changed to alfuzosin 10 mg daily and given a trial of Gemtesa at his visit in July 2024.  He returns today for follow-up.  He continues on alfuzosin and Gemtesa.  He has noted improvement in his lower urinary tract symptoms with this combination.  He has had decreased urgency and incontinence.  He also has decreased nocturia.  No side effects from the medication.  He feels like he empties his bladder with an improved stream.  No dysuria or gross hematuria.  Portions of the above documentation were copied from a prior visit for review purposes  only.   Past Medical History:  Past Medical History:  Diagnosis Date   Atrial fibrillation (HCC)    C2 cervical fracture (HCC)    Gilbert's disease    Gout    Hyperlipidemia    Hypertension    Skin cancer     Past Surgical History:  Past Surgical History:  Procedure Laterality Date   CARDIOVERSION N/A 05/16/2014   Procedure: CARDIOVERSION;  Surgeon: Wendall Stade, MD;  Location: [facility] ENDOSCOPY;  Service: Cardiovascular;  Laterality: N/A;   CARDIOVERSION N/A 03/05/2016   Procedure: CARDIOVERSION;  Surgeon: Laurey Morale, MD;  Location: [facility] ENDOSCOPY;  Service: Cardiovascular;  Laterality: N/A;   CERVICAL DISC ARTHROPLASTY     x 2   MOHS SURGERY     PROSTATE BIOPSY     TONSILLECTOMY      Allergies:  Allergies  Allergen Reactions   Codeine Nausea And Vomiting    Family History:  Family History  Problem Relation Age of Onset   Cancer Mother        Breast   Heart attack Father 72       Died age 85 of [place]    Social History:  Social History   Tobacco Use   Smoking status: Former    Types: Cigarettes   Smokeless tobacco: Never   Tobacco comments:    Quit 33 years ago.  Vaping Use   Vaping status: Never Used  Substance Use Topics   Alcohol  use: Yes    Alcohol/week: 3.0 standard drinks of alcohol    Types: 3 Standard drinks or equivalent per week   Drug use: No    ROS: Constitutional:  Negative for fever, chills, weight loss CV: Negative for chest pain, previous MI, hypertension Respiratory:  Negative for shortness of breath, wheezing, sleep apnea, frequent cough GI:  Negative for nausea, vomiting, bloody stool, GERD  Physical exam: BP 124/73   Pulse 69   Ht 5\' 10"  (1.778 m)   Wt 200 lb (90.7 kg)   BMI 28.70 kg/m  GENERAL APPEARANCE:  Well appearing, well developed, well nourished, NAD HEENT:  Atraumatic, normocephalic, oropharynx clear NECK:  Supple without lymphadenopathy or thyromegaly ABDOMEN:  Soft, non-tender, no masses EXTREMITIES:  Moves all  extremities well, without clubbing, cyanosis, or edema NEUROLOGIC:  Alert and oriented x 3, normal gait, CN II-XII grossly intact MENTAL STATUS:  appropriate BACK:  Non-tender to palpation, No CVAT SKIN:  Warm, dry, and intact   Results: U/A:  negative

## 2022-12-23 DIAGNOSIS — K080 Exfoliation of teeth due to systemic causes: Secondary | ICD-10-CM | POA: Diagnosis not present

## 2023-02-18 ENCOUNTER — Ambulatory Visit: Payer: Medicare Other | Admitting: Urology

## 2023-02-18 NOTE — Progress Notes (Deleted)
Assessment: 1. BPH with obstruction/lower urinary tract symptoms   2. Incontinence without sensory awareness     Plan: Continue alfuzosin 10 mg daily and Gemtesa 75 mg daily. Return to office in 2 months  Chief Complaint:  No chief complaint on file.   History of Present Illness:  Wayne Howell is a 82 y.o. male who is seen for continued evaluation of BPH with lower urinary tract symptoms and urinary incontinence. He has been followed at [facility] initially with Dr. Isabel Caprice and most recently with Dr. Berneice Heinrich with his last visit in September 2023.  He has been on tamsulosin twice daily for a number of years for his obstructive urinary symptoms.  He previously tried Information systems manager which he did not tolerate.  He noted increased nocturia in August 2023 with some associated urgency and leakage.  He was given a trial of Myrbetriq.  He reported discontinuing the medication due to side effects of constipation.  He currently has urinary frequency, voiding every 2 hours, occasional urgency, nocturia x 3, and urinary incontinence without awareness.  He reports that he leaks throughout the day and at night.  He does wear a adult diaper daily.  He voids with a slow stream and feels like he empties his bladder completely.  No dysuria or gross hematuria.  No recent UTIs.  IPSS = 15 QOL= 5. PVR = 125 ml.  He was changed to alfuzosin 10 mg daily and given a trial of Gemtesa at his visit in July 2024.  At his visit in 8/24, he continued on alfuzosin and Gemtesa.  He noted improvement in his lower urinary tract symptoms with this combination.  He had decreased urgency and incontinence and decreased nocturia.  No side effects from the medication.  He felt like he emptied his bladder with an improved stream.  No dysuria or gross hematuria.  Portions of the above documentation were copied from a prior visit for review purposes only.   Past Medical History:  Past Medical History:  Diagnosis Date   Atrial  fibrillation (HCC)    C2 cervical fracture (HCC)    Gilbert's disease    Gout    Hyperlipidemia    Hypertension    Skin cancer     Past Surgical History:  Past Surgical History:  Procedure Laterality Date   CARDIOVERSION N/A 05/16/2014   Procedure: CARDIOVERSION;  Surgeon: Wendall Stade, MD;  Location: [facility] ENDOSCOPY;  Service: Cardiovascular;  Laterality: N/A;   CARDIOVERSION N/A 03/05/2016   Procedure: CARDIOVERSION;  Surgeon: Laurey Morale, MD;  Location: [facility] ENDOSCOPY;  Service: Cardiovascular;  Laterality: N/A;   CERVICAL DISC ARTHROPLASTY     x 2   MOHS SURGERY     PROSTATE BIOPSY     TONSILLECTOMY      Allergies:  Allergies  Allergen Reactions   Codeine Nausea And Vomiting    Family History:  Family History  Problem Relation Age of Onset   Cancer Mother        Breast   Heart attack Father 13       Died age 37 of [place]    Social History:  Social History   Tobacco Use   Smoking status: Former    Types: Cigarettes   Smokeless tobacco: Never   Tobacco comments:    Quit 33 years ago.  Vaping Use   Vaping status: Never Used  Substance Use Topics   Alcohol use: Yes    Alcohol/week: 3.0 standard drinks of alcohol  Types: 3 Standard drinks or equivalent per week   Drug use: No    ROS: Constitutional:  Negative for fever, chills, weight loss CV: Negative for chest pain, previous MI, hypertension Respiratory:  Negative for shortness of breath, wheezing, sleep apnea, frequent cough GI:  Negative for nausea, vomiting, bloody stool, GERD  Physical exam: There were no vitals taken for this visit. GENERAL APPEARANCE:  Well appearing, well developed, well nourished, NAD HEENT:  Atraumatic, normocephalic, oropharynx clear NECK:  Supple without lymphadenopathy or thyromegaly ABDOMEN:  Soft, non-tender, no masses EXTREMITIES:  Moves all extremities well, without clubbing, cyanosis, or edema NEUROLOGIC:  Alert and oriented x 3, normal gait, CN II-XII grossly  intact MENTAL STATUS:  appropriate BACK:  Non-tender to palpation, No CVAT SKIN:  Warm, dry, and intact   Results: U/A:

## 2023-02-25 ENCOUNTER — Ambulatory Visit: Payer: Medicare Other | Admitting: Urology

## 2023-02-25 ENCOUNTER — Encounter: Payer: Self-pay | Admitting: Urology

## 2023-02-25 VITALS — BP 112/68 | HR 86 | Ht 70.0 in | Wt 205.0 lb

## 2023-02-25 DIAGNOSIS — N138 Other obstructive and reflux uropathy: Secondary | ICD-10-CM | POA: Diagnosis not present

## 2023-02-25 DIAGNOSIS — N419 Inflammatory disease of prostate, unspecified: Secondary | ICD-10-CM | POA: Diagnosis not present

## 2023-02-25 DIAGNOSIS — N3942 Incontinence without sensory awareness: Secondary | ICD-10-CM | POA: Diagnosis not present

## 2023-02-25 DIAGNOSIS — N401 Enlarged prostate with lower urinary tract symptoms: Secondary | ICD-10-CM | POA: Diagnosis not present

## 2023-02-25 LAB — URINALYSIS, ROUTINE W REFLEX MICROSCOPIC
Bilirubin, UA: NEGATIVE
Glucose, UA: NEGATIVE
Ketones, UA: NEGATIVE
Leukocytes,UA: NEGATIVE
Nitrite, UA: NEGATIVE
Protein,UA: NEGATIVE
RBC, UA: NEGATIVE
Specific Gravity, UA: 1.025 (ref 1.005–1.030)
Urobilinogen, Ur: 4 mg/dL — ABNORMAL HIGH (ref 0.2–1.0)
pH, UA: 6 (ref 5.0–7.5)

## 2023-02-25 LAB — BLADDER SCAN AMB NON-IMAGING

## 2023-02-25 MED ORDER — LEVOFLOXACIN 500 MG PO TABS
500.0000 mg | ORAL_TABLET | Freq: Every day | ORAL | 0 refills | Status: AC
Start: 2023-02-25 — End: 2023-03-11

## 2023-02-25 NOTE — Progress Notes (Signed)
Assessment: 1. BPH with obstruction/lower urinary tract symptoms   2. Incontinence without sensory awareness   3. Prostatitis, unspecified prostatitis type    Plan: Continue alfuzosin 10 mg daily and Gemtesa 75 mg daily. Levaquin 500 mg daily x 14 days for prostatitis Call with results in 3-4 weeks If no improvement, may need to consider trial of different medication for incontinence  Chief Complaint:  Chief Complaint  Patient presents with   Benign Prostatic Hypertrophy    History of Present Illness:  Wayne Howell is a 82 y.o. male who is seen for continued evaluation of BPH with lower urinary tract symptoms and urinary incontinence. He has been followed at [facility] initially with Dr. Isabel Caprice and most recently with Dr. Berneice Heinrich with his last visit in September 2023.  He has been on tamsulosin twice daily for a number of years for his obstructive urinary symptoms.  He previously tried Information systems manager which he did not tolerate.  He noted increased nocturia in August 2023 with some associated urgency and leakage.  He was given a trial of Myrbetriq.  He reported discontinuing the medication due to side effects of constipation.  He currently has urinary frequency, voiding every 2 hours, occasional urgency, nocturia x 3, and urinary incontinence without awareness.  He reports that he leaks throughout the day and at night.  He does wear a adult diaper daily.  He voids with a slow stream and feels like he empties his bladder completely.  No dysuria or gross hematuria.  No recent UTIs.  IPSS = 15 QOL= 5. PVR = 125 ml.  He was changed to alfuzosin 10 mg daily and given a trial of Gemtesa at his visit in July 2024.  At his visit in 8/24, he continued on alfuzosin and Gemtesa.  He noted improvement in his lower urinary tract symptoms with this combination.  He had decreased urgency and incontinence and decreased nocturia.  No side effects from the medication.  He felt like he emptied his bladder  with an improved stream.  No dysuria or gross hematuria.  He return today for follow-up.  He continues with symptoms of frequency, decreased stream, hesitancy, intermittent stream, dysuria, and incontinence. IPSS = 18 QOL = 5/6  Portions of the above documentation were copied from a prior visit for review purposes only.   Past Medical History:  Past Medical History:  Diagnosis Date   Atrial fibrillation (HCC)    C2 cervical fracture (HCC)    Gilbert's disease    Gout    Hyperlipidemia    Hypertension    Skin cancer     Past Surgical History:  Past Surgical History:  Procedure Laterality Date   CARDIOVERSION N/A 05/16/2014   Procedure: CARDIOVERSION;  Surgeon: Wendall Stade, MD;  Location: [facility] ENDOSCOPY;  Service: Cardiovascular;  Laterality: N/A;   CARDIOVERSION N/A 03/05/2016   Procedure: CARDIOVERSION;  Surgeon: Laurey Morale, MD;  Location: [facility] ENDOSCOPY;  Service: Cardiovascular;  Laterality: N/A;   CERVICAL DISC ARTHROPLASTY     x 2   MOHS SURGERY     PROSTATE BIOPSY     TONSILLECTOMY      Allergies:  Allergies  Allergen Reactions   Codeine Nausea And Vomiting    Family History:  Family History  Problem Relation Age of Onset   Cancer Mother        Breast   Heart attack Father 23       Died age 56 of [place]    Social History:  Social History   Tobacco Use   Smoking status: Former    Types: Cigarettes   Smokeless tobacco: Never   Tobacco comments:    Quit 33 years ago.  Vaping Use   Vaping status: Never Used  Substance Use Topics   Alcohol use: Yes    Alcohol/week: 3.0 standard drinks of alcohol    Types: 3 Standard drinks or equivalent per week   Drug use: No    ROS: Constitutional:  Negative for fever, chills, weight loss CV: Negative for chest pain, previous MI, hypertension Respiratory:  Negative for shortness of breath, wheezing, sleep apnea, frequent cough GI:  Negative for nausea, vomiting, bloody stool, GERD  Physical exam: BP 112/68    Pulse 86   Ht 5\' 10"  (1.778 m)   Wt 205 lb (93 kg)   BMI 29.41 kg/m  GENERAL APPEARANCE:  Well appearing, well developed, well nourished, NAD HEENT:  Atraumatic, normocephalic, oropharynx clear NECK:  Supple without lymphadenopathy or thyromegaly ABDOMEN:  Soft, non-tender, no masses EXTREMITIES:  Moves all extremities well, without clubbing, cyanosis, or edema NEUROLOGIC:  Alert and oriented x 3, normal gait, CN II-XII grossly intact MENTAL STATUS:  appropriate BACK:  Non-tender to palpation, No CVAT SKIN:  Warm, dry, and intact GU: Prostate: 40 g, minimally tender Rectum: Normal tone,  no masses or tenderness   Results: U/A:  negative  PVR = 12 ml

## 2023-03-07 NOTE — Progress Notes (Deleted)
  Cardiology Office Note:   Date:  03/07/2023  ID:  Wayne Howell, DOB 16-Jan-1941, MRN [id number] PCP: Daisy Floro, MD  [facility] Providers Cardiologist:  Rollene Rotunda, MD {  History of Present Illness:   Wayne Howell is a 82 y.o. male who presents for evaluation of  atrial fibrillation. He is status post cardioversion in the past.  However, he had recurrent atrial fib.  He does not feel his fibrillation.     He presents for follow up.  ***   ***  Since I last saw him he has done well. The patient denies any new symptoms such as chest discomfort, neck or arm discomfort. There has been no new shortness of breath, PND or orthopnea. There have been no reported palpitations, presyncope or syncope.  He said he did have his heart rate being in the 60s once that was unusual.  She was in the 70s or 80s.  He has neck problems and gets around slowly and walks with a cane.  He has balance issues.  ROS: ***  Studies Reviewed:    EKG:       ***  Risk Assessment/Calculations:   {Does this patient have ATRIAL FIBRILLATION?[phone]} No BP recorded.  {Refresh Note OR Click here to enter BP  :1}***        Physical Exam:   VS:  There were no vitals taken for this visit.   Wt Readings from Last 3 Encounters:  02/25/23 205 lb (93 kg)  12/17/22 200 lb (90.7 kg)  11/18/22 206 lb (93.4 kg)     GEN: Well nourished, well developed in no acute distress NECK: No JVD; No carotid bruits CARDIAC: ***RR, *** murmurs, rubs, gallops RESPIRATORY:  Clear to auscultation without rales, wheezing or rhonchi  ABDOMEN: Soft, non-tender, non-distended EXTREMITIES:  No edema; No deformity   ASSESSMENT AND PLAN:   ATRIAL FIB:    ***  He tolerates this.  The patient tolerates this and has good rate control.  He is not having any trouble with anticoagulation.  We have briefly talked in the past about Watchman when he brought it up.  However, I think he is doing well and has no significant  contraindications to the anticoagulation so he will continue it.    HTN: His blood pressure is ***at target.  No change in therapy.      Follow up ***  Signed, Rollene Rotunda, MD

## 2023-03-08 ENCOUNTER — Ambulatory Visit: Payer: Medicare Other | Admitting: Cardiology

## 2023-03-08 DIAGNOSIS — I482 Chronic atrial fibrillation, unspecified: Secondary | ICD-10-CM

## 2023-03-08 DIAGNOSIS — I10 Essential (primary) hypertension: Secondary | ICD-10-CM

## 2023-03-09 ENCOUNTER — Telehealth: Payer: Self-pay | Admitting: Urology

## 2023-03-09 NOTE — Telephone Encounter (Signed)
Pt states he has been taking it for 3-4 months now with no issues, realizes this is not the cause. He goes on to say he is going to continue taking the medication.

## 2023-03-09 NOTE — Telephone Encounter (Signed)
Pt has been taking Gemtesa for 5 days. And has had severe diarrhea for those 5 days. Wants to know if he could be prescribed something else.

## 2023-03-15 ENCOUNTER — Telehealth: Payer: Self-pay | Admitting: Urology

## 2023-03-15 ENCOUNTER — Other Ambulatory Visit: Payer: Self-pay | Admitting: Urology

## 2023-03-15 DIAGNOSIS — N3942 Incontinence without sensory awareness: Secondary | ICD-10-CM

## 2023-03-15 MED ORDER — TROSPIUM CHLORIDE 20 MG PO TABS: 20.0000 mg | ORAL_TABLET | Freq: Two times a day (BID) | ORAL | 5 refills | Status: AC

## 2023-03-15 NOTE — Telephone Encounter (Signed)
Pt was told to call and update [place] on symptoms in 3-4 weeks. I told him I would let him know that he called.

## 2023-03-19 NOTE — Progress Notes (Unsigned)
  Cardiology Office Note:   Date:  03/22/2023  ID:  ROMMELL MCCUMBERS, DOB 03/08/1941, MRN [id number] PCP: Daisy Floro, MD  [facility] Providers Cardiologist:  Rollene Rotunda, MD {  History of Present Illness:   Wayne Howell is a 82 y.o. male  who presents for evaluation of  atrial fibrillation. He is status post cardioversion in the past.  However, he had recurrent atrial fib.   He denies any new cardiovascular symptoms.  He gets around with his cane.  In the house he uses his walker so he does not hit his cat with his cane.  The patient denies any new symptoms such as chest discomfort, neck or arm discomfort. There has been no new shortness of breath, PND or orthopnea. There have been no reported palpitations, presyncope or syncope.     ROS: Urinary incontinence and anxiety.  Otherwise as stated in the HPI and negative for all other systems.   Studies Reviewed:    EKG:   EKG Interpretation Date/Time:  Monday March 22 2023 09:09:07 EST Ventricular Rate:  100 PR Interval:    QRS Duration:  82 QT Interval:  322 QTC Calculation: 415 R Axis:   8  Text Interpretation: Atrial fibrillation When compared with ECG of 10-Nov-2016 10:46, rate was faster. Confirmed by Rollene Rotunda ([id number]) on 03/22/2023 9:16:31 AM     Risk Assessment/Calculations:    CHA2DS2-VASc Score = 3   This indicates a 3.2% annual risk of stroke. The patient's score is based upon: CHF History: 0 HTN History: 1 Diabetes History: 0 Stroke History: 0 Vascular Disease History: 0 Age Score: 2 Gender Score: 0   Physical Exam:   VS:  BP (!) 90/58 (BP Location: Right Arm, Patient Position: Sitting, Cuff Size: Large)   Pulse (!) 102   Ht 5\' 10"  (1.778 m)   Wt 206 lb (93.4 kg)   SpO2 96%   BMI 29.56 kg/m    Wt Readings from Last 3 Encounters:  03/22/23 206 lb (93.4 kg)  02/25/23 205 lb (93 kg)  12/17/22 200 lb (90.7 kg)     GEN: Well nourished, well developed in no acute distress NECK:  No JVD; No carotid bruits CARDIAC: Irregular RR, no murmurs, rubs, gallops RESPIRATORY:  Clear to auscultation without rales, wheezing or rhonchi  ABDOMEN: Soft, non-tender, non-distended EXTREMITIES:  Moderate edema; No deformity   ASSESSMENT AND PLAN:   ATRIAL FIB: He tolerates anticoagulation.  He does not really feel his fibrillation.  No change in therapy.   HTN: His blood pressure is running low.  I will reduce his lisinopril to 10 mg daily and he will watch his blood pressure.   DYSLIPIDEMIA: LDL was 58.  No change in therapy.   Follow up with me in 1 year  Signed, Rollene Rotunda, MD

## 2023-03-22 ENCOUNTER — Ambulatory Visit: Payer: Medicare Other | Attending: Cardiology | Admitting: Cardiology

## 2023-03-22 ENCOUNTER — Encounter: Payer: Self-pay | Admitting: Cardiology

## 2023-03-22 VITALS — BP 90/58 | HR 102 | Ht 70.0 in | Wt 206.0 lb

## 2023-03-22 DIAGNOSIS — I4891 Unspecified atrial fibrillation: Secondary | ICD-10-CM

## 2023-03-22 DIAGNOSIS — E785 Hyperlipidemia, unspecified: Secondary | ICD-10-CM

## 2023-03-22 DIAGNOSIS — I10 Essential (primary) hypertension: Secondary | ICD-10-CM

## 2023-03-22 MED ORDER — LISINOPRIL 10 MG PO TABS: 10.0000 mg | ORAL_TABLET | Freq: Every day | ORAL | 3 refills | Status: AC

## 2023-03-22 NOTE — Patient Instructions (Signed)
Medication Instructions:  Reduce lisinopril to 10 mg daily. New script sent. *If you need a refill on your cardiac medications before your next appointment, please call your pharmacy*     Follow-Up: At [facility], you and your health needs are our priority.  As part of our continuing mission to provide you with exceptional heart care, we have created designated Provider Care Teams.  These Care Teams include your primary Cardiologist (physician) and Advanced Practice Providers (APPs -  Physician Assistants and Nurse Practitioners) who all work together to provide you with the care you need, when you need it.  Your next appointment:   12 month(s)  Provider:   Rollene Rotunda, MD

## 2023-03-24 ENCOUNTER — Ambulatory Visit: Payer: Medicare Other | Admitting: Urology

## 2023-03-24 ENCOUNTER — Encounter: Payer: Self-pay | Admitting: Urology

## 2023-03-24 ENCOUNTER — Telehealth: Payer: Self-pay | Admitting: Urology

## 2023-03-24 DIAGNOSIS — R34 Anuria and oliguria: Secondary | ICD-10-CM

## 2023-03-24 DIAGNOSIS — N3942 Incontinence without sensory awareness: Secondary | ICD-10-CM | POA: Diagnosis not present

## 2023-03-24 DIAGNOSIS — N138 Other obstructive and reflux uropathy: Secondary | ICD-10-CM

## 2023-03-24 DIAGNOSIS — N401 Enlarged prostate with lower urinary tract symptoms: Secondary | ICD-10-CM | POA: Diagnosis not present

## 2023-03-24 LAB — BLADDER SCAN AMB NON-IMAGING

## 2023-03-24 NOTE — Telephone Encounter (Signed)
Patient states he has not been able to urinate. Has been taking Gemtesa.

## 2023-03-24 NOTE — Telephone Encounter (Signed)
More to pts first story -  Was taking gemtesa then started taking trospium but didnt like that and switched back to gemtesa and now he hasnt peed in 3 days.

## 2023-03-24 NOTE — Progress Notes (Signed)
Assessment: 1. Incontinence without sensory awareness   2. BPH with obstruction/lower urinary tract symptoms   3. Decreased urine output    Plan: He is not in urinary retention at this time. He has decreased his fluid intake and may be dehydrated. Recommend that he discontinue Gemtesa and trospium. Continue alfuzosin 10 mg daily  BMP today. Return to office in 1-2 weeks.  Chief Complaint:  Chief Complaint  Patient presents with   Urinary Retention    History of Present Illness:  Wayne Howell is a 82 y.o. male who is seen for urgent evaluation of possible urinary retention.  He has a history of BPH with lower urinary tract symptoms and urinary incontinence. He has been followed at [facility] initially with Dr. Isabel Caprice and most recently with Dr. Berneice Heinrich with his last visit in September 2023.  He has been on tamsulosin twice daily for a number of years for his obstructive urinary symptoms.  He previously tried Information systems manager which he did not tolerate.  He noted increased nocturia in August 2023 with some associated urgency and leakage.  He was given a trial of Myrbetriq.  He reported discontinuing the medication due to side effects of constipation.  He currently has urinary frequency, voiding every 2 hours, occasional urgency, nocturia x 3, and urinary incontinence without awareness.  He reports that he leaks throughout the day and at night.  He does wear a adult diaper daily.  He voids with a slow stream and feels like he empties his bladder completely.  No dysuria or gross hematuria.  No recent UTIs.  IPSS = 15 QOL= 5. PVR = 125 ml.  He was changed to alfuzosin 10 mg daily and given a trial of Gemtesa at his visit in July 2024.  At his visit in 8/24, he continued on alfuzosin and Gemtesa.  He noted improvement in his lower urinary tract symptoms with this combination.  He had decreased urgency and incontinence and decreased nocturia.  No side effects from the medication.  He felt  like he emptied his bladder with an improved stream.  No dysuria or gross hematuria.  At his visit in 10/24, he continue with symptoms of frequency, decreased stream, hesitancy, intermittent stream, dysuria, and incontinence. IPSS = 18 QOL = 5/6 He was subsequently changed to trospium 20 mg twice daily due to continued symptoms while on Gemtesa. He reports that the trospium did not improve his symptoms and he resumed taking the [place].  He took the [place] for approximately 3 days and reports difficulty voiding.  He reports only small voids for the past several days.  He continues to have incontinence.  He is concerned about retention and presented to the office for urgent evaluation this morning.  Portions of the above documentation were copied from a prior visit for review purposes only.   Past Medical History:  Past Medical History:  Diagnosis Date   Atrial fibrillation (HCC)    C2 cervical fracture (HCC)    Gilbert's disease    Gout    Hyperlipidemia    Hypertension    Skin cancer     Past Surgical History:  Past Surgical History:  Procedure Laterality Date   CARDIOVERSION N/A 05/16/2014   Procedure: CARDIOVERSION;  Surgeon: Wendall Stade, MD;  Location: [facility] ENDOSCOPY;  Service: Cardiovascular;  Laterality: N/A;   CARDIOVERSION N/A 03/05/2016   Procedure: CARDIOVERSION;  Surgeon: Laurey Morale, MD;  Location: [facility] ENDOSCOPY;  Service: Cardiovascular;  Laterality: N/A;   CERVICAL DISC  ARTHROPLASTY     x 2   MOHS SURGERY     PROSTATE BIOPSY     TONSILLECTOMY      Allergies:  Allergies  Allergen Reactions   Codeine Nausea And Vomiting    Family History:  Family History  Problem Relation Age of Onset   Cancer Mother        Breast   Heart attack Father 17       Died age 71 of [place]    Social History:  Social History   Tobacco Use   Smoking status: Former    Types: Cigarettes   Smokeless tobacco: Never   Tobacco comments:    Quit 33 years ago.  Vaping Use    Vaping status: Never Used  Substance Use Topics   Alcohol use: Yes    Alcohol/week: 3.0 standard drinks of alcohol    Types: 3 Standard drinks or equivalent per week   Drug use: No    ROS: Constitutional:  Negative for fever, chills, weight loss CV: Negative for chest pain, previous MI, hypertension Respiratory:  Negative for shortness of breath, wheezing, sleep apnea, frequent cough GI:  Negative for nausea, vomiting, bloody stool, GERD  Physical exam: GENERAL APPEARANCE:  Well appearing, well developed, well nourished, NAD HEENT:  Atraumatic, normocephalic, oropharynx clear NECK:  Supple without lymphadenopathy or thyromegaly ABDOMEN:  Soft, non-tender, no masses EXTREMITIES:  Moves all extremities well; bilateral LE edema NEUROLOGIC:  Alert and oriented x 3, CN II-XII grossly intact MENTAL STATUS:  appropriate BACK:  Non-tender to palpation, No CVAT SKIN:  Warm, dry, and intact   Results: None  PVR = 35 ml

## 2023-03-25 ENCOUNTER — Encounter: Payer: Self-pay | Admitting: Urology

## 2023-03-25 LAB — BASIC METABOLIC PANEL
BUN/Creatinine Ratio: 9 — ABNORMAL LOW (ref 10–24)
BUN: 10 mg/dL (ref 8–27)
CO2: 25 mmol/L (ref 20–29)
Calcium: 8.7 mg/dL (ref 8.6–10.2)
Chloride: 99 mmol/L (ref 96–106)
Creatinine, Ser: 1.07 mg/dL (ref 0.76–1.27)
Glucose: 100 mg/dL — ABNORMAL HIGH (ref 70–99)
Potassium: 4.1 mmol/L (ref 3.5–5.2)
Sodium: 139 mmol/L (ref 134–144)
eGFR: 68 mL/min/{1.73_m2} (ref >59)

## 2023-03-30 DIAGNOSIS — L821 Other seborrheic keratosis: Secondary | ICD-10-CM | POA: Diagnosis not present

## 2023-03-30 DIAGNOSIS — Z85828 Personal history of other malignant neoplasm of skin: Secondary | ICD-10-CM | POA: Diagnosis not present

## 2023-03-30 DIAGNOSIS — L570 Actinic keratosis: Secondary | ICD-10-CM | POA: Diagnosis not present

## 2023-03-30 DIAGNOSIS — L308 Other specified dermatitis: Secondary | ICD-10-CM | POA: Diagnosis not present

## 2023-03-30 DIAGNOSIS — D044 Carcinoma in situ of skin of scalp and neck: Secondary | ICD-10-CM | POA: Diagnosis not present

## 2023-03-30 DIAGNOSIS — D485 Neoplasm of uncertain behavior of skin: Secondary | ICD-10-CM | POA: Diagnosis not present

## 2023-03-30 DIAGNOSIS — L814 Other melanin hyperpigmentation: Secondary | ICD-10-CM | POA: Diagnosis not present

## 2023-04-02 ENCOUNTER — Ambulatory Visit: Payer: Medicare Other | Admitting: Urology

## 2023-04-02 ENCOUNTER — Encounter: Payer: Self-pay | Admitting: Urology

## 2023-04-02 VITALS — BP 119/71 | HR 97 | Ht 70.0 in | Wt 205.0 lb

## 2023-04-02 DIAGNOSIS — N401 Enlarged prostate with lower urinary tract symptoms: Secondary | ICD-10-CM

## 2023-04-02 DIAGNOSIS — N3942 Incontinence without sensory awareness: Secondary | ICD-10-CM

## 2023-04-02 DIAGNOSIS — N138 Other obstructive and reflux uropathy: Secondary | ICD-10-CM

## 2023-04-02 NOTE — Progress Notes (Signed)
Assessment: 1. Incontinence without sensory awareness   2. BPH with obstruction/lower urinary tract symptoms     Plan: Continue alfuzosin 10 mg daily   Recommend further evaluation with cystoscopy and urodynamics. Refer to [facility] for urodynamic evaluation Return after study done for possible cystoscopy  Chief Complaint:  Chief Complaint  Patient presents with   Urinary Incontinence    History of Present Illness:  Wayne Howell is a 82 y.o. male who is seen for further evaluation of BPH with obstruction and urinary incontinence.  He has a history of BPH with lower urinary tract symptoms and urinary incontinence. He has been followed at [facility] initially with Dr. Isabel Caprice and most recently with Dr. Berneice Heinrich with his last visit in September 2023.  He has been on tamsulosin twice daily for a number of years for his obstructive urinary symptoms.  He previously tried Information systems manager which he did not tolerate.  He noted increased nocturia in August 2023 with some associated urgency and leakage.  He was given a trial of Myrbetriq.  He reported discontinuing the medication due to side effects of constipation.  He currently has urinary frequency, voiding every 2 hours, occasional urgency, nocturia x 3, and urinary incontinence without awareness.  He reports that he leaks throughout the day and at night.  He does wear a adult diaper daily.  He voids with a slow stream and feels like he empties his bladder completely.  No dysuria or gross hematuria.  No recent UTIs.  IPSS = 15 QOL= 5. PVR = 125 ml.  He was changed to alfuzosin 10 mg daily and given a trial of Gemtesa at his visit in July 2024.  At his visit in 8/24, he continued on alfuzosin and Gemtesa.  He noted improvement in his lower urinary tract symptoms with this combination.  He had decreased urgency and incontinence and decreased nocturia.  No side effects from the medication.  He felt like he emptied his bladder with an  improved stream.  No dysuria or gross hematuria.  At his visit in 10/24, he continue with symptoms of frequency, decreased stream, hesitancy, intermittent stream, dysuria, and incontinence. IPSS = 18 QOL = 5/6 He was subsequently changed to trospium 20 mg twice daily due to continued symptoms while on Gemtesa. He reports that the trospium did not improve his symptoms and he resumed taking the [place].  He took the [place] for approximately 3 days and reports difficulty voiding.  He reported only small voids for several days.  He continued to have incontinence.  He was concerned about retention and presented to the office for urgent evaluation on 03/24/23. PVR = 35 ml. BMP was normal.  He returns today for further evaluation.  He continues on alfuzosin.  He reports that his frequency, urgency, and incontinence have continued.  He does feel like he is able to empty his bladder without difficulty.  No dysuria or gross hematuria. IPSS = 23 today   Portions of the above documentation were copied from a prior visit for review purposes only.   Past Medical History:  Past Medical History:  Diagnosis Date   Atrial fibrillation (HCC)    C2 cervical fracture (HCC)    Gilbert's disease    Gout    Hyperlipidemia    Hypertension    Skin cancer     Past Surgical History:  Past Surgical History:  Procedure Laterality Date   CARDIOVERSION N/A 05/16/2014   Procedure: CARDIOVERSION;  Surgeon: Wendall Stade, MD;  Location: [facility] ENDOSCOPY;  Service: Cardiovascular;  Laterality: N/A;   CARDIOVERSION N/A 03/05/2016   Procedure: CARDIOVERSION;  Surgeon: Laurey Morale, MD;  Location: [facility] ENDOSCOPY;  Service: Cardiovascular;  Laterality: N/A;   CERVICAL DISC ARTHROPLASTY     x 2   MOHS SURGERY     PROSTATE BIOPSY     TONSILLECTOMY      Allergies:  Allergies  Allergen Reactions   Codeine Nausea And Vomiting    Family History:  Family History  Problem Relation Age of Onset   Cancer Mother         Breast   Heart attack Father 61       Died age 44 of [place]    Social History:  Social History   Tobacco Use   Smoking status: Former    Types: Cigarettes   Smokeless tobacco: Never   Tobacco comments:    Quit 33 years ago.  Vaping Use   Vaping status: Never Used  Substance Use Topics   Alcohol use: Yes    Alcohol/week: 3.0 standard drinks of alcohol    Types: 3 Standard drinks or equivalent per week   Drug use: No    ROS: Constitutional:  Negative for fever, chills, weight loss CV: Negative for chest pain, previous MI, hypertension Respiratory:  Negative for shortness of breath, wheezing, sleep apnea, frequent cough GI:  Negative for nausea, vomiting, bloody stool, GERD  Physical exam: BP 119/71   Pulse 97   Ht 5\' 10"  (1.778 m)   Wt 205 lb (93 kg)   BMI 29.41 kg/m  GENERAL APPEARANCE:  Well appearing, well developed, well nourished, NAD HEENT:  Atraumatic, normocephalic, oropharynx clear NECK:  Supple without lymphadenopathy or thyromegaly ABDOMEN:  Soft, non-tender, no masses EXTREMITIES:  Moves all extremities well, without clubbing, cyanosis, or edema NEUROLOGIC:  Alert and oriented x 3, normal gait, CN II-XII grossly intact MENTAL STATUS:  appropriate BACK:  Non-tender to palpation, No CVAT SKIN:  Warm, dry, and intact   Results: U/A: negative

## 2023-04-05 LAB — URINALYSIS, ROUTINE W REFLEX MICROSCOPIC
Bilirubin, UA: NEGATIVE
Glucose, UA: NEGATIVE
Ketones, UA: NEGATIVE
Leukocytes,UA: NEGATIVE
Nitrite, UA: NEGATIVE
Protein,UA: NEGATIVE
RBC, UA: NEGATIVE
Specific Gravity, UA: >1.03 — ABNORMAL HIGH (ref 1.005–1.030)
Urobilinogen, Ur: 0.2 mg/dL (ref 0.2–1.0)
pH, UA: 5.5 (ref 5.0–7.5)

## 2023-04-22 DIAGNOSIS — R3915 Urgency of urination: Secondary | ICD-10-CM | POA: Diagnosis not present

## 2023-04-26 ENCOUNTER — Encounter: Payer: Self-pay | Admitting: Urology

## 2023-04-29 ENCOUNTER — Telehealth: Payer: Self-pay | Admitting: Urology

## 2023-04-29 NOTE — Telephone Encounter (Signed)
 Pt received test results from bladder scan he had at [facility]. Wants to know what happens next.

## 2023-05-18 ENCOUNTER — Encounter: Payer: Self-pay | Admitting: Urology

## 2023-05-18 ENCOUNTER — Ambulatory Visit: Payer: Medicare Other | Admitting: Urology

## 2023-05-18 VITALS — BP 98/65 | HR 97 | Ht 70.0 in | Wt 200.0 lb

## 2023-05-18 DIAGNOSIS — N401 Enlarged prostate with lower urinary tract symptoms: Secondary | ICD-10-CM

## 2023-05-18 DIAGNOSIS — N3942 Incontinence without sensory awareness: Secondary | ICD-10-CM

## 2023-05-18 DIAGNOSIS — N138 Other obstructive and reflux uropathy: Secondary | ICD-10-CM

## 2023-05-18 MED ORDER — CIPROFLOXACIN HCL 500 MG PO TABS
500.0000 mg | ORAL_TABLET | Freq: Once | ORAL | Status: AC
  Administered 2023-05-18: 500 mg via ORAL

## 2023-05-18 NOTE — Progress Notes (Signed)
Assessment: 1. Incontinence without sensory awareness   2. BPH with obstruction/lower urinary tract symptoms     Plan: I reviewed the urodynamic results and the findings on cystoscopy with the patient in detail today. The urodynamic study does not show evidence of bladder instability, incontinence, or obvious obstruction.  His bladder was empty on both the prestudy cath volume and poststudy residual.  Additionally, there is not anatomic evidence of obstruction on cystoscopy today.  Consequently, I advised him that I do not think any procedures for bladder outlet obstruction would be beneficial.  His primary issue is incontinence without awareness.  Interestingly, the urodynamic study did not demonstrate evidence of bladder instability which would most likely explain his symptoms.  He has tried a number of medications for management of his incontinence without significant benefit.  He did report that the Gemtesa seem to improve his symptoms the most.  He expressed that he is extremely upset and depressed about his current situation in regards to his urinary incontinence.  However, he also expressed that he did not wish to take more medication.  I advised him that I do not have a clear explanation for the etiology of his incontinence and consequently, I am unable to offer him recommendations for additional management other than what he has already tried.  He expressed a interest in obtaining a second opinion.  I recommended a referral to Dr. Alfredo Martinez at [facility]. Additionally, he would like to retry the [place].  He currently has a supply of the medication at home. Cipro x 1 following cystoscopy Continue alfuzosin 10 mg daily   Resume Gemtesa 75 mg daily. Will arrange consultation with Dr.MacDiarmid  I personally spent 30 minutes involved in face to face and non-face-to-face activities for this patient on the day of the visit.  Professional time spent included the following  activities, in addition to those noted in the documentation: Review of urodynamic study, discussion of these results with the patient, extensive discussion of his prior management, arrangements for referral for second opinion.  Chief Complaint:  Chief Complaint  Patient presents with   Cysto    History of Present Illness:  Wayne Howell is a 83 y.o. male who is seen for further evaluation of BPH with obstruction and urinary incontinence.  He has a history of BPH with lower urinary tract symptoms and urinary incontinence. He has been followed at [facility] initially with Dr. Isabel Caprice and most recently with Dr. Berneice Heinrich with his last visit in September 2023.  He had been on tamsulosin twice daily for a number of years for his obstructive urinary symptoms.  He previously tried Information systems manager which he did not tolerate.  He noted increased nocturia in August 2023 with some associated urgency and leakage.  He was given a trial of Myrbetriq.  He reported discontinuing the medication due to side effects of constipation.  He has urinary frequency, voiding every 2 hours, occasional urgency, nocturia x 3, and urinary incontinence without awareness.  He reports that he leaks throughout the day and at night.  He does wear a adult diaper daily.  He voids with a slow stream and feels like he empties his bladder completely.  No dysuria or gross hematuria.  No recent UTIs.  IPSS = 15 QOL= 5. PVR = 125 ml.  He was changed to alfuzosin 10 mg daily and given a trial of Gemtesa at his visit in July 2024.  At his visit in 8/24, he continued on alfuzosin and Gemtesa.  He noted improvement in his lower urinary tract symptoms with this combination.  He had decreased urgency and incontinence and decreased nocturia.  No side effects from the medication.  He felt like he emptied his bladder with an improved stream.  No dysuria or gross hematuria.  At his visit in 10/24, he continue with symptoms of frequency, decreased  stream, hesitancy, intermittent stream, dysuria, and incontinence. IPSS = 18 QOL = 5/6 He was subsequently changed to trospium 20 mg twice daily due to continued symptoms while on Gemtesa. He reports that the trospium did not improve his symptoms and he resumed taking the [place].  He took the [place] for approximately 3 days and reported difficulty voiding.  He reported only small voids for several days.  He continued to have incontinence.  He was concerned about retention and presented to the office for urgent evaluation on 03/24/23. PVR = 35 ml. BMP was normal.  At his visit in 12/24, he continued on alfuzosin.  He reported that his frequency, urgency, and incontinence  continued.  He was able to empty his bladder without difficulty.  No dysuria or gross hematuria. IPSS = 23. PVR = 35 ml.  He was evaluated with urodynamics on 04/22/2023.  This study showed a residual of 50 mL.  Bladder capacity was normal at 433 mL.  Normal sensation noted.  No bladder instability was seen.  No leakage with Valsalva.  The patient was able to generate a voluntary contraction but was not able to void during the test.  He did void around 450 mL after completion of the study.  Max detrusor pressure during voiding was 27 cm of water.  He reports today for further evaluation with cystoscopy.  He continues on alfuzosin. He reports continued urinary frequency, urgency, and urinary incontinence without awareness.  The incontinence is occurring primarily at night but during the daytime as well.  Portions of the above documentation were copied from a prior visit for review purposes only.   Past Medical History:  Past Medical History:  Diagnosis Date   Atrial fibrillation (HCC)    C2 cervical fracture (HCC)    Gilbert's disease    Gout    Hyperlipidemia    Hypertension    Skin cancer     Past Surgical History:  Past Surgical History:  Procedure Laterality Date   CARDIOVERSION N/A 05/16/2014   Procedure:  CARDIOVERSION;  Surgeon: Wendall Stade, MD;  Location: [facility] ENDOSCOPY;  Service: Cardiovascular;  Laterality: N/A;   CARDIOVERSION N/A 03/05/2016   Procedure: CARDIOVERSION;  Surgeon: Laurey Morale, MD;  Location: [facility] ENDOSCOPY;  Service: Cardiovascular;  Laterality: N/A;   CERVICAL DISC ARTHROPLASTY     x 2   MOHS SURGERY     PROSTATE BIOPSY     TONSILLECTOMY      Allergies:  Allergies  Allergen Reactions   Codeine Nausea And Vomiting    Family History:  Family History  Problem Relation Age of Onset   Cancer Mother        Breast   Heart attack Father 38       Died age 49 of [place]    Social History:  Social History   Tobacco Use   Smoking status: Former    Types: Cigarettes   Smokeless tobacco: Never   Tobacco comments:    Quit 33 years ago.  Vaping Use   Vaping status: Never Used  Substance Use Topics   Alcohol use: Yes    Alcohol/week: 3.0 standard drinks  of alcohol    Types: 3 Standard drinks or equivalent per week   Drug use: No    ROS: Constitutional:  Negative for fever, chills, weight loss CV: Negative for chest pain, previous MI, hypertension Respiratory:  Negative for shortness of breath, wheezing, sleep apnea, frequent cough GI:  Negative for nausea, vomiting, bloody stool, GERD  Physical exam: BP 98/65   Pulse 97   Ht 5\' 10"  (1.778 m)   Wt 200 lb (90.7 kg)   BMI 28.70 kg/m  GENERAL APPEARANCE:  Well appearing, well developed, well nourished, NAD HEENT:  Atraumatic, normocephalic, oropharynx clear NECK:  Supple without lymphadenopathy or thyromegaly ABDOMEN:  Soft, non-tender, no masses EXTREMITIES:  Moves all extremities well, without clubbing, cyanosis, or edema NEUROLOGIC:  Alert and oriented x 3, normal gait, CN II-XII grossly intact MENTAL STATUS:  appropriate BACK:  Non-tender to palpation, No CVAT SKIN:  Warm, dry, and intact   Results: U/A: negative  Procedure:  Flexible Cystourethroscopy  Pre-operative Diagnosis: Urinary  incontinence  Post-operative Diagnosis: Urinary incontinence  Anesthesia:  local with lidocaine jelly  Surgical Narrative:  After appropriate informed consent was obtained, the patient was prepped and draped in the usual sterile fashion in the supine position.  The patient was correctly identified and the proper procedure delineated prior to proceeding.  Sterile lidocaine gel was instilled in the urethra. The flexible cystoscope was introduced without difficulty.  Findings:  Anterior urethra: Normal  Posterior urethra:  minimal enlargement of prostate  Bladder:  no mucosal abnormalities, no significant trabeculations or cellules  Ureteral orifices: normal  Additional findings: none  Saline bladder wash for cytology was not performed.    The cystoscope was then removed.  The patient tolerated the procedure well.

## 2023-05-19 LAB — URINALYSIS, ROUTINE W REFLEX MICROSCOPIC
Bilirubin, UA: NEGATIVE
Glucose, UA: NEGATIVE
Ketones, UA: NEGATIVE
Leukocytes,UA: NEGATIVE
Nitrite, UA: NEGATIVE
Protein,UA: NEGATIVE
RBC, UA: NEGATIVE
Specific Gravity, UA: 1.015 (ref 1.005–1.030)
Urobilinogen, Ur: 1 mg/dL (ref 0.2–1.0)
pH, UA: 6.5 (ref 5.0–7.5)

## 2023-05-25 ENCOUNTER — Other Ambulatory Visit: Payer: Self-pay | Admitting: Urology

## 2023-05-25 DIAGNOSIS — N3942 Incontinence without sensory awareness: Secondary | ICD-10-CM

## 2023-05-27 DIAGNOSIS — S0011XA Contusion of right eyelid and periocular area, initial encounter: Secondary | ICD-10-CM | POA: Diagnosis not present

## 2023-05-27 DIAGNOSIS — H1131 Conjunctival hemorrhage, right eye: Secondary | ICD-10-CM | POA: Diagnosis not present

## 2023-06-04 DIAGNOSIS — I10 Essential (primary) hypertension: Secondary | ICD-10-CM | POA: Diagnosis not present

## 2023-06-04 DIAGNOSIS — R32 Unspecified urinary incontinence: Secondary | ICD-10-CM | POA: Diagnosis not present

## 2023-06-04 DIAGNOSIS — R609 Edema, unspecified: Secondary | ICD-10-CM | POA: Diagnosis not present

## 2023-06-04 DIAGNOSIS — I959 Hypotension, unspecified: Secondary | ICD-10-CM | POA: Diagnosis not present

## 2023-06-15 ENCOUNTER — Telehealth: Payer: Self-pay | Admitting: Cardiology

## 2023-06-15 NOTE — Telephone Encounter (Signed)
Patient identification verified by 2 forms. Marilynn Rail, RN    Called and spoke to patient  Patient states:   -has a severe case of urinary incontinence   -incontinence on going for 1 year  -incontinence occurs during the day and night time   -looked up and saw Xarelto can cause incontinence   -would like alternate blood thinner that does not cause incontinence  Informed patient message sent to Dr. Antoine Poche and pharmacy for assistance  Patient agrees with plan, no questions at this time

## 2023-06-15 NOTE — Telephone Encounter (Signed)
Pt c/o medication issue:  1. Name of Medication: Xarelto  2. How are you currently taking this medication (dosage and times per day)?   3. Are you having a reaction (difficulty breathing--STAT)?   4. What is your medication issue? Having some problems with taking the medicine that he needs to discuss

## 2023-06-17 NOTE — Telephone Encounter (Signed)
Pavero, Cristal Deer, RPH  You; Rollene Rotunda, MD2 hours ago (12:36 PM)    Urinary incontinence is not a side effect of Xarelto. Recommend he follow up with urology or PCP. If patient still wants to change, can change to Eliquis   Patient identification verified by 2 forms. Marilynn Rail, RN    Called and spoke to patient  Relayed pharmacy message  Patient states:   -Rx is doing well, does not wish to change   -has appointment with Urology on 3/18  Patient has no further questions at this time

## 2023-06-18 DIAGNOSIS — Z79899 Other long term (current) drug therapy: Secondary | ICD-10-CM | POA: Diagnosis not present

## 2023-06-18 DIAGNOSIS — R32 Unspecified urinary incontinence: Secondary | ICD-10-CM | POA: Diagnosis not present

## 2023-06-21 DIAGNOSIS — D044 Carcinoma in situ of skin of scalp and neck: Secondary | ICD-10-CM | POA: Diagnosis not present

## 2023-06-23 DIAGNOSIS — K080 Exfoliation of teeth due to systemic causes: Secondary | ICD-10-CM | POA: Diagnosis not present

## 2023-06-30 DIAGNOSIS — H353131 Nonexudative age-related macular degeneration, bilateral, early dry stage: Secondary | ICD-10-CM | POA: Diagnosis not present

## 2023-06-30 DIAGNOSIS — Z961 Presence of intraocular lens: Secondary | ICD-10-CM | POA: Diagnosis not present

## 2023-06-30 DIAGNOSIS — H43813 Vitreous degeneration, bilateral: Secondary | ICD-10-CM | POA: Diagnosis not present

## 2023-06-30 DIAGNOSIS — H2512 Age-related nuclear cataract, left eye: Secondary | ICD-10-CM | POA: Diagnosis not present

## 2023-07-13 DIAGNOSIS — N3944 Nocturnal enuresis: Secondary | ICD-10-CM | POA: Diagnosis not present

## 2023-07-13 DIAGNOSIS — N3942 Incontinence without sensory awareness: Secondary | ICD-10-CM | POA: Diagnosis not present

## 2023-07-13 DIAGNOSIS — R3915 Urgency of urination: Secondary | ICD-10-CM | POA: Diagnosis not present

## 2023-08-12 DIAGNOSIS — N3942 Incontinence without sensory awareness: Secondary | ICD-10-CM | POA: Diagnosis not present

## 2023-08-12 DIAGNOSIS — R3915 Urgency of urination: Secondary | ICD-10-CM | POA: Diagnosis not present

## 2023-08-18 ENCOUNTER — Other Ambulatory Visit: Payer: Self-pay | Admitting: Urology

## 2023-08-18 DIAGNOSIS — N401 Enlarged prostate with lower urinary tract symptoms: Secondary | ICD-10-CM

## 2023-08-20 DIAGNOSIS — R3915 Urgency of urination: Secondary | ICD-10-CM | POA: Diagnosis not present

## 2023-08-23 DIAGNOSIS — R609 Edema, unspecified: Secondary | ICD-10-CM | POA: Diagnosis not present

## 2023-08-23 DIAGNOSIS — Z6833 Body mass index (BMI) 33.0-33.9, adult: Secondary | ICD-10-CM | POA: Diagnosis not present

## 2023-08-27 DIAGNOSIS — R3915 Urgency of urination: Secondary | ICD-10-CM | POA: Diagnosis not present

## 2023-09-03 ENCOUNTER — Other Ambulatory Visit: Payer: Self-pay | Admitting: Cardiology

## 2023-09-03 DIAGNOSIS — R3915 Urgency of urination: Secondary | ICD-10-CM | POA: Diagnosis not present

## 2023-09-03 DIAGNOSIS — I482 Chronic atrial fibrillation, unspecified: Secondary | ICD-10-CM

## 2023-09-03 NOTE — Telephone Encounter (Signed)
 Xarelto  20mg  refill request received. Pt is 83 years old, weight-90.7kg, Crea-1.07 on 03/24/23, last seen by Dr. Lavonne Prairie on 03/22/23, Diagnosis-Afib, CrCl-65.93 mL/min; Dose is appropriate based on dosing criteria. Will send in refill to requested pharmacy.

## 2023-09-10 DIAGNOSIS — R3915 Urgency of urination: Secondary | ICD-10-CM | POA: Diagnosis not present

## 2023-09-17 DIAGNOSIS — N3942 Incontinence without sensory awareness: Secondary | ICD-10-CM | POA: Diagnosis not present

## 2023-09-17 DIAGNOSIS — N401 Enlarged prostate with lower urinary tract symptoms: Secondary | ICD-10-CM | POA: Diagnosis not present

## 2023-09-17 DIAGNOSIS — R350 Frequency of micturition: Secondary | ICD-10-CM | POA: Diagnosis not present

## 2023-09-17 DIAGNOSIS — R3915 Urgency of urination: Secondary | ICD-10-CM | POA: Diagnosis not present

## 2023-09-24 DIAGNOSIS — R3915 Urgency of urination: Secondary | ICD-10-CM | POA: Diagnosis not present

## 2023-09-24 DIAGNOSIS — R350 Frequency of micturition: Secondary | ICD-10-CM | POA: Diagnosis not present

## 2023-09-28 DIAGNOSIS — Z85828 Personal history of other malignant neoplasm of skin: Secondary | ICD-10-CM | POA: Diagnosis not present

## 2023-09-28 DIAGNOSIS — L570 Actinic keratosis: Secondary | ICD-10-CM | POA: Diagnosis not present

## 2023-09-28 DIAGNOSIS — Z86008 Personal history of in-situ neoplasm of other site: Secondary | ICD-10-CM | POA: Diagnosis not present

## 2023-09-28 DIAGNOSIS — L814 Other melanin hyperpigmentation: Secondary | ICD-10-CM | POA: Diagnosis not present

## 2023-09-28 DIAGNOSIS — L821 Other seborrheic keratosis: Secondary | ICD-10-CM | POA: Diagnosis not present

## 2023-10-01 DIAGNOSIS — R350 Frequency of micturition: Secondary | ICD-10-CM | POA: Diagnosis not present

## 2023-10-01 DIAGNOSIS — R3915 Urgency of urination: Secondary | ICD-10-CM | POA: Diagnosis not present

## 2023-10-08 DIAGNOSIS — R350 Frequency of micturition: Secondary | ICD-10-CM | POA: Diagnosis not present

## 2023-10-08 DIAGNOSIS — R3915 Urgency of urination: Secondary | ICD-10-CM | POA: Diagnosis not present

## 2023-10-15 DIAGNOSIS — R3915 Urgency of urination: Secondary | ICD-10-CM | POA: Diagnosis not present

## 2023-10-15 DIAGNOSIS — R350 Frequency of micturition: Secondary | ICD-10-CM | POA: Diagnosis not present

## 2023-10-22 DIAGNOSIS — R3915 Urgency of urination: Secondary | ICD-10-CM | POA: Diagnosis not present

## 2023-10-22 DIAGNOSIS — R350 Frequency of micturition: Secondary | ICD-10-CM | POA: Diagnosis not present

## 2023-10-26 DIAGNOSIS — K080 Exfoliation of teeth due to systemic causes: Secondary | ICD-10-CM | POA: Diagnosis not present

## 2023-11-05 DIAGNOSIS — R350 Frequency of micturition: Secondary | ICD-10-CM | POA: Diagnosis not present

## 2023-11-05 DIAGNOSIS — R3915 Urgency of urination: Secondary | ICD-10-CM | POA: Diagnosis not present

## 2023-11-10 DIAGNOSIS — M109 Gout, unspecified: Secondary | ICD-10-CM | POA: Diagnosis not present

## 2023-11-10 DIAGNOSIS — Z0000 Encounter for general adult medical examination without abnormal findings: Secondary | ICD-10-CM | POA: Diagnosis not present

## 2023-11-10 DIAGNOSIS — Z79899 Other long term (current) drug therapy: Secondary | ICD-10-CM | POA: Diagnosis not present

## 2023-11-10 DIAGNOSIS — D696 Thrombocytopenia, unspecified: Secondary | ICD-10-CM | POA: Diagnosis not present

## 2023-11-10 DIAGNOSIS — Z1331 Encounter for screening for depression: Secondary | ICD-10-CM | POA: Diagnosis not present

## 2023-11-10 DIAGNOSIS — E7800 Pure hypercholesterolemia, unspecified: Secondary | ICD-10-CM | POA: Diagnosis not present

## 2023-11-12 DIAGNOSIS — R350 Frequency of micturition: Secondary | ICD-10-CM | POA: Diagnosis not present

## 2023-11-12 DIAGNOSIS — R3915 Urgency of urination: Secondary | ICD-10-CM | POA: Diagnosis not present

## 2023-11-18 DIAGNOSIS — Z23 Encounter for immunization: Secondary | ICD-10-CM | POA: Diagnosis not present

## 2023-11-18 DIAGNOSIS — E7800 Pure hypercholesterolemia, unspecified: Secondary | ICD-10-CM | POA: Diagnosis not present

## 2023-11-18 DIAGNOSIS — M545 Low back pain, unspecified: Secondary | ICD-10-CM | POA: Diagnosis not present

## 2023-11-18 DIAGNOSIS — Z0000 Encounter for general adult medical examination without abnormal findings: Secondary | ICD-10-CM | POA: Diagnosis not present

## 2023-11-18 DIAGNOSIS — F411 Generalized anxiety disorder: Secondary | ICD-10-CM | POA: Diagnosis not present

## 2023-11-18 DIAGNOSIS — R609 Edema, unspecified: Secondary | ICD-10-CM | POA: Diagnosis not present

## 2023-11-19 DIAGNOSIS — R350 Frequency of micturition: Secondary | ICD-10-CM | POA: Diagnosis not present

## 2023-11-19 DIAGNOSIS — R3915 Urgency of urination: Secondary | ICD-10-CM | POA: Diagnosis not present

## 2023-11-24 ENCOUNTER — Other Ambulatory Visit: Payer: Self-pay | Admitting: Urology

## 2023-11-24 DIAGNOSIS — N138 Other obstructive and reflux uropathy: Secondary | ICD-10-CM

## 2023-12-07 DIAGNOSIS — M4696 Unspecified inflammatory spondylopathy, lumbar region: Secondary | ICD-10-CM | POA: Diagnosis not present

## 2023-12-15 DIAGNOSIS — R262 Difficulty in walking, not elsewhere classified: Secondary | ICD-10-CM | POA: Diagnosis not present

## 2023-12-15 DIAGNOSIS — M4696 Unspecified inflammatory spondylopathy, lumbar region: Secondary | ICD-10-CM | POA: Diagnosis not present

## 2023-12-17 DIAGNOSIS — R262 Difficulty in walking, not elsewhere classified: Secondary | ICD-10-CM | POA: Diagnosis not present

## 2023-12-17 DIAGNOSIS — R350 Frequency of micturition: Secondary | ICD-10-CM | POA: Diagnosis not present

## 2023-12-17 DIAGNOSIS — M4696 Unspecified inflammatory spondylopathy, lumbar region: Secondary | ICD-10-CM | POA: Diagnosis not present

## 2023-12-17 DIAGNOSIS — R3915 Urgency of urination: Secondary | ICD-10-CM | POA: Diagnosis not present

## 2023-12-22 DIAGNOSIS — R262 Difficulty in walking, not elsewhere classified: Secondary | ICD-10-CM | POA: Diagnosis not present

## 2023-12-22 DIAGNOSIS — M4696 Unspecified inflammatory spondylopathy, lumbar region: Secondary | ICD-10-CM | POA: Diagnosis not present

## 2023-12-24 DIAGNOSIS — R262 Difficulty in walking, not elsewhere classified: Secondary | ICD-10-CM | POA: Diagnosis not present

## 2023-12-24 DIAGNOSIS — M4696 Unspecified inflammatory spondylopathy, lumbar region: Secondary | ICD-10-CM | POA: Diagnosis not present

## 2024-01-04 DIAGNOSIS — R262 Difficulty in walking, not elsewhere classified: Secondary | ICD-10-CM | POA: Diagnosis not present

## 2024-01-04 DIAGNOSIS — M4696 Unspecified inflammatory spondylopathy, lumbar region: Secondary | ICD-10-CM | POA: Diagnosis not present

## 2024-01-06 DIAGNOSIS — M4696 Unspecified inflammatory spondylopathy, lumbar region: Secondary | ICD-10-CM | POA: Diagnosis not present

## 2024-01-06 DIAGNOSIS — R262 Difficulty in walking, not elsewhere classified: Secondary | ICD-10-CM | POA: Diagnosis not present

## 2024-01-10 DIAGNOSIS — M47816 Spondylosis without myelopathy or radiculopathy, lumbar region: Secondary | ICD-10-CM | POA: Diagnosis not present

## 2024-01-12 DIAGNOSIS — K080 Exfoliation of teeth due to systemic causes: Secondary | ICD-10-CM | POA: Diagnosis not present

## 2024-01-13 DIAGNOSIS — M47816 Spondylosis without myelopathy or radiculopathy, lumbar region: Secondary | ICD-10-CM | POA: Diagnosis not present

## 2024-01-14 DIAGNOSIS — R350 Frequency of micturition: Secondary | ICD-10-CM | POA: Diagnosis not present

## 2024-01-14 DIAGNOSIS — R3915 Urgency of urination: Secondary | ICD-10-CM | POA: Diagnosis not present

## 2024-01-19 DIAGNOSIS — D485 Neoplasm of uncertain behavior of skin: Secondary | ICD-10-CM | POA: Diagnosis not present

## 2024-02-11 DIAGNOSIS — R350 Frequency of micturition: Secondary | ICD-10-CM | POA: Diagnosis not present

## 2024-02-21 DIAGNOSIS — D485 Neoplasm of uncertain behavior of skin: Secondary | ICD-10-CM | POA: Diagnosis not present

## 2024-02-21 DIAGNOSIS — D0462 Carcinoma in situ of skin of left upper limb, including shoulder: Secondary | ICD-10-CM | POA: Diagnosis not present

## 2024-02-21 DIAGNOSIS — L570 Actinic keratosis: Secondary | ICD-10-CM | POA: Diagnosis not present

## 2024-02-27 ENCOUNTER — Other Ambulatory Visit: Payer: Self-pay | Admitting: Cardiology

## 2024-02-27 DIAGNOSIS — I482 Chronic atrial fibrillation, unspecified: Secondary | ICD-10-CM

## 2024-02-28 NOTE — Telephone Encounter (Signed)
 Prescription refill request for Xarelto  received.  Indication:afib Last office visit:11/24 Weight:90.7  kg Age:83 Scr:1.07  11/24 CrCl:64.75  ml/min  Prescription refilled

## 2024-03-08 DIAGNOSIS — D0462 Carcinoma in situ of skin of left upper limb, including shoulder: Secondary | ICD-10-CM | POA: Diagnosis not present

## 2024-03-10 DIAGNOSIS — R350 Frequency of micturition: Secondary | ICD-10-CM | POA: Diagnosis not present

## 2024-04-07 DIAGNOSIS — R350 Frequency of micturition: Secondary | ICD-10-CM | POA: Diagnosis not present
